# Patient Record
Sex: Male | Born: 1965
Health system: Southern US, Community
[De-identification: ages and names within clinical notes are randomized; demographics above are authoritative.]

## PROBLEM LIST (undated history)

## (undated) DIAGNOSIS — G4730 Sleep apnea, unspecified: Secondary | ICD-10-CM

## (undated) DIAGNOSIS — Z8619 Personal history of other infectious and parasitic diseases: Secondary | ICD-10-CM

## (undated) DIAGNOSIS — E119 Type 2 diabetes mellitus without complications: Secondary | ICD-10-CM

## (undated) DIAGNOSIS — M109 Gout, unspecified: Secondary | ICD-10-CM

## (undated) HISTORY — DX: Personal history of other infectious and parasitic diseases: Z86.19

## (undated) HISTORY — DX: Sleep apnea, unspecified: G47.30

## (undated) HISTORY — DX: Type 2 diabetes mellitus without complications: E11.9

---

## 2004-04-13 HISTORY — PX: OTHER SURGICAL HISTORY: SHX169

## 2004-04-30 HISTORY — PX: OTHER SURGICAL HISTORY: SHX169

## 2009-07-06 DIAGNOSIS — E781 Pure hyperglyceridemia: Secondary | ICD-10-CM | POA: Insufficient documentation

## 2013-09-02 LAB — LIPID PANEL
Cholesterol: 164 mg/dL (ref 0–200)
HDL: 29 mg/dL — AB (ref 35–70)
LDL CALC: 89 mg/dL
TRIGLYCERIDES: 229 mg/dL — AB (ref 40–160)

## 2013-09-02 LAB — TSH: TSH: 2.64 u[IU]/mL (ref 0.41–5.90)

## 2013-09-02 LAB — BASIC METABOLIC PANEL: Glucose: 107 mg/dL

## 2015-10-27 ENCOUNTER — Encounter: Payer: Self-pay | Admitting: Family Medicine

## 2015-10-27 ENCOUNTER — Ambulatory Visit (INDEPENDENT_AMBULATORY_CARE_PROVIDER_SITE_OTHER): Payer: BC Managed Care – PPO | Admitting: Family Medicine

## 2015-10-27 VITALS — BP 118/80 | HR 72 | Temp 98.9°F | Resp 14 | Ht 67.75 in | Wt 310.0 lb

## 2015-10-27 DIAGNOSIS — J069 Acute upper respiratory infection, unspecified: Secondary | ICD-10-CM | POA: Diagnosis not present

## 2015-10-27 DIAGNOSIS — R05 Cough: Secondary | ICD-10-CM | POA: Diagnosis not present

## 2015-10-27 DIAGNOSIS — M109 Gout, unspecified: Secondary | ICD-10-CM | POA: Insufficient documentation

## 2015-10-27 DIAGNOSIS — K219 Gastro-esophageal reflux disease without esophagitis: Secondary | ICD-10-CM | POA: Insufficient documentation

## 2015-10-27 DIAGNOSIS — R030 Elevated blood-pressure reading, without diagnosis of hypertension: Secondary | ICD-10-CM

## 2015-10-27 DIAGNOSIS — Z8739 Personal history of other diseases of the musculoskeletal system and connective tissue: Secondary | ICD-10-CM | POA: Insufficient documentation

## 2015-10-27 DIAGNOSIS — IMO0001 Reserved for inherently not codable concepts without codable children: Secondary | ICD-10-CM | POA: Insufficient documentation

## 2015-10-27 DIAGNOSIS — G4733 Obstructive sleep apnea (adult) (pediatric): Secondary | ICD-10-CM | POA: Insufficient documentation

## 2015-10-27 DIAGNOSIS — R059 Cough, unspecified: Secondary | ICD-10-CM

## 2015-10-27 MED ORDER — BENZONATATE 100 MG PO CAPS: 100.0000 mg | ORAL_CAPSULE | Freq: Two times a day (BID) | ORAL | Status: DC | PRN

## 2015-10-27 MED ORDER — AMOXICILLIN 500 MG PO CAPS: 1000.0000 mg | ORAL_CAPSULE | Freq: Two times a day (BID) | ORAL | Status: AC

## 2015-10-27 NOTE — Progress Notes (Signed)
Patient ID: Adam Wolfe, male   DOB: Sep 17, 1966, 50 y.o.   MRN: [id number]       Patient: Adam Wolfe Male    DOB: 07-03-1966   50 y.o.   MRN: [id number] Visit Date: 10/27/2015  Today's Provider: Lelon Huh, MD   Chief Complaint  Patient presents with  . Cough   Subjective:    HPI  Patient has had trouble with dry cough for 3 days now. Other symptoms include: rattling in the chest, chest tightness, slight headache. NO fever or chills. He has taking some Nyquil.no daytime medications.     No Known Allergies Previous Medications   ALLOPURINOL (ZYLOPRIM) 300 MG TABLET    Take 1 tablet by mouth daily.   OMEGA-3 FATTY ACIDS (FISH OIL) 1000 MG CAPS    Take 3 capsules by mouth 2 (two) times daily with a meal. Reported on 10/27/2015    Review of Systems  Constitutional: Negative.   HENT: Positive for congestion. Negative for ear discharge, ear pain, nosebleeds, postnasal drip, sinus pressure and sore throat.   Respiratory: Positive for cough and chest tightness. Negative for shortness of breath.   Cardiovascular: Negative.   Musculoskeletal: Negative.     Social History  Substance Use Topics  . Smoking status: Never Smoker   . Smokeless tobacco: Current User    Types: Chew  . Alcohol Use: 0.0 oz/week    0 Standard drinks or equivalent per week     Comment: occasional use   Objective:   BP 118/80 mmHg  Pulse 72  Temp(Src) 98.9 F (37.2 C)  Resp 14  Ht 5' 7.75" (1.721 m)  Wt 310 lb (140.615 kg)  BMI 47.48 kg/m2  SpO2 97%  Physical Exam  General Appearance:    Alert, cooperative, no distress, morbidly obese  HENT:   ENT exam normal, no neck nodes or sinus tenderness  Eyes:    PERRL, conjunctiva/corneas clear, EOM's intact       Lungs:     Rare rhonchi which clears with cough. No rales or wheezes bilaterally, respirations unlabored  Heart:    Regular rate and rhythm  Neurologic:   Awake, alert, oriented x 3. No apparent focal neurological           defect.           Assessment & Plan:     1. Upper respiratory infection Counseled regarding signs and symptoms of viral and bacterial respiratory infections. Advised to start antibiotic if he develops any sign of bacterial infection, or if current symptoms last longer than 7 days.   - amoxicillin (AMOXIL) 500 MG capsule; Take 2 capsules (1,000 mg total) by mouth 2 (two) times daily.  Dispense: 40 capsule; Refill: 0  2. Cough OTC Mucinex  - benzonatate (TESSALON) 100 MG capsule; Take 1 capsule (100 mg total) by mouth 2 (two) times daily as needed for cough.  Dispense: 20 capsule; Refill: 0       Lelon Huh, MD  [facility]

## 2015-10-27 NOTE — Patient Instructions (Signed)
   Start taking OTC Mucinex (guaifenesin) for cough  Start taking amoxicillin if you develop fever, shortness of breath, or if symptoms are not improving within a week   Cough, Adult Coughing is a reflex that clears your throat and your airways. Coughing helps to heal and protect your lungs. It is normal to cough occasionally, but a cough that happens with other symptoms or lasts a long time may be a sign of a condition that needs treatment. A cough may last only 2-3 weeks (acute), or it may last longer than 8 weeks (chronic). CAUSES Coughing is commonly caused by:  Breathing in substances that irritate your lungs.  A viral or bacterial respiratory infection.  Allergies.  Asthma.  Postnasal drip.  Smoking.  Acid backing up from the stomach into the esophagus (gastroesophageal reflux).  Certain medicines.  Chronic lung problems, including COPD (or rarely, lung cancer).  Other medical conditions such as heart failure. HOME CARE INSTRUCTIONS  Pay attention to any changes in your symptoms. Take these actions to help with your discomfort:  Take medicines only as told by your health care provider.  If you were prescribed an antibiotic medicine, take it as told by your health care provider. Do not stop taking the antibiotic even if you start to feel better.  Talk with your health care provider before you take a cough suppressant medicine.  Drink enough fluid to keep your urine clear or pale yellow.  If the air is dry, use a cold steam vaporizer or humidifier in your bedroom or your home to help loosen secretions.  Avoid anything that causes you to cough at work or at home.  If your cough is worse at night, try sleeping in a semi-upright position.  Avoid cigarette smoke. If you smoke, quit smoking. If you need help quitting, ask your health care provider.  Avoid caffeine.  Avoid alcohol.  Rest as needed. SEEK MEDICAL CARE IF:   You have new symptoms.  You cough up  pus.  Your cough does not get better after 2-3 weeks, or your cough gets worse.  You cannot control your cough with suppressant medicines and you are losing sleep.  You develop pain that is getting worse or pain that is not controlled with pain medicines.  You have a fever.  You have unexplained weight loss.  You have night sweats. SEEK IMMEDIATE MEDICAL CARE IF:  You cough up blood.  You have difficulty breathing.  Your heartbeat is very fast.   This information is not intended to replace advice given to you by your health care provider. Make sure you discuss any questions you have with your health care provider.   Document Released: 03/29/2011 Document Revised: 06/21/2015 Document Reviewed: 12/07/2014 Elsevier Interactive Patient Education [employer].

## 2015-10-31 ENCOUNTER — Encounter: Payer: Self-pay | Admitting: Family Medicine

## 2015-12-31 ENCOUNTER — Other Ambulatory Visit: Payer: Self-pay | Admitting: Family Medicine

## 2016-04-24 DIAGNOSIS — L03115 Cellulitis of right lower limb: Secondary | ICD-10-CM | POA: Diagnosis not present

## 2016-04-28 ENCOUNTER — Inpatient Hospital Stay
Admission: EM | Admit: 2016-04-28 | Discharge: 2016-05-02 | DRG: 603 | Disposition: A | Discharge status: Home / Self Care | Payer: BC Managed Care – PPO | Attending: Internal Medicine | Admitting: Internal Medicine

## 2016-04-28 ENCOUNTER — Encounter: Payer: Self-pay | Admitting: Emergency Medicine

## 2016-04-28 DIAGNOSIS — T501X5A Adverse effect of loop [high-ceiling] diuretics, initial encounter: Secondary | ICD-10-CM | POA: Diagnosis present

## 2016-04-28 DIAGNOSIS — N179 Acute kidney failure, unspecified: Secondary | ICD-10-CM | POA: Diagnosis present

## 2016-04-28 DIAGNOSIS — Z825 Family history of asthma and other chronic lower respiratory diseases: Secondary | ICD-10-CM

## 2016-04-28 DIAGNOSIS — L270 Generalized skin eruption due to drugs and medicaments taken internally: Secondary | ICD-10-CM | POA: Diagnosis present

## 2016-04-28 DIAGNOSIS — F17220 Nicotine dependence, chewing tobacco, uncomplicated: Secondary | ICD-10-CM | POA: Diagnosis not present

## 2016-04-28 DIAGNOSIS — M109 Gout, unspecified: Secondary | ICD-10-CM | POA: Diagnosis not present

## 2016-04-28 DIAGNOSIS — L03115 Cellulitis of right lower limb: Secondary | ICD-10-CM | POA: Diagnosis not present

## 2016-04-28 DIAGNOSIS — Z881 Allergy status to other antibiotic agents status: Secondary | ICD-10-CM | POA: Diagnosis not present

## 2016-04-28 DIAGNOSIS — Z8249 Family history of ischemic heart disease and other diseases of the circulatory system: Secondary | ICD-10-CM | POA: Diagnosis not present

## 2016-04-28 DIAGNOSIS — Z79899 Other long term (current) drug therapy: Secondary | ICD-10-CM

## 2016-04-28 DIAGNOSIS — T368X5A Adverse effect of other systemic antibiotics, initial encounter: Secondary | ICD-10-CM | POA: Diagnosis not present

## 2016-04-28 DIAGNOSIS — L0390 Cellulitis, unspecified: Secondary | ICD-10-CM | POA: Diagnosis present

## 2016-04-28 DIAGNOSIS — Z809 Family history of malignant neoplasm, unspecified: Secondary | ICD-10-CM | POA: Diagnosis not present

## 2016-04-28 HISTORY — DX: Gout, unspecified: M10.9

## 2016-04-28 LAB — COMPREHENSIVE METABOLIC PANEL
ALK PHOS: 51 U/L (ref 38–126)
ALT: 38 U/L (ref 17–63)
AST: 39 U/L (ref 15–41)
Albumin: 3.6 g/dL (ref 3.5–5.0)
Anion gap: 9 (ref 5–15)
BILIRUBIN TOTAL: 0.3 mg/dL (ref 0.3–1.2)
BUN: 15 mg/dL (ref 6–20)
CALCIUM: 9 mg/dL (ref 8.9–10.3)
CO2: 23 mmol/L (ref 22–32)
CREATININE: 1.16 mg/dL (ref 0.61–1.24)
Chloride: 104 mmol/L (ref 101–111)
GFR calc Af Amer: >60 mL/min (ref >60)
GLUCOSE: 171 mg/dL — AB (ref 65–99)
POTASSIUM: 4.1 mmol/L (ref 3.5–5.1)
Sodium: 136 mmol/L (ref 135–145)
TOTAL PROTEIN: 7.5 g/dL (ref 6.5–8.1)

## 2016-04-28 LAB — CBC
HEMATOCRIT: 45.7 % (ref 40.0–52.0)
Hemoglobin: 15.7 g/dL (ref 13.0–18.0)
MCH: 31.3 pg (ref 26.0–34.0)
MCHC: 34.4 g/dL (ref 32.0–36.0)
MCV: 91 fL (ref 80.0–100.0)
PLATELETS: 223 10*3/uL (ref 150–440)
RBC: 5.02 MIL/uL (ref 4.40–5.90)
RDW: 13.7 % (ref 11.5–14.5)
WBC: 8.1 10*3/uL (ref 3.8–10.6)

## 2016-04-28 MED ORDER — VANCOMYCIN HCL 10 G IV SOLR
2000.0000 mg | INTRAVENOUS | Status: AC
  Administered 2016-04-28: 2000 mg via INTRAVENOUS
  Filled 2016-04-28: qty 2000

## 2016-04-28 MED ORDER — ACETAMINOPHEN 325 MG PO TABS: 650.0000 mg | ORAL_TABLET | Freq: Four times a day (QID) | ORAL | Status: DC | PRN

## 2016-04-28 MED ORDER — VANCOMYCIN HCL 10 G IV SOLR
1250.0000 mg | Freq: Two times a day (BID) | INTRAVENOUS | Status: DC
  Administered 2016-04-29 (×2): 1250 mg via INTRAVENOUS
  Filled 2016-04-28 (×4): qty 1250

## 2016-04-28 MED ORDER — ONDANSETRON HCL 4 MG/2ML IJ SOLN: 4.0000 mg | Freq: Four times a day (QID) | INTRAMUSCULAR | Status: DC | PRN

## 2016-04-28 MED ORDER — HYDROCODONE-ACETAMINOPHEN 5-325 MG PO TABS: 1.0000 | ORAL_TABLET | ORAL | Status: DC | PRN

## 2016-04-28 MED ORDER — ONDANSETRON HCL 4 MG PO TABS
4.0000 mg | ORAL_TABLET | Freq: Four times a day (QID) | ORAL | Status: DC | PRN
Start: 2016-04-28 — End: 2016-05-02

## 2016-04-28 MED ORDER — ACETAMINOPHEN 650 MG RE SUPP: 650.0000 mg | Freq: Four times a day (QID) | RECTAL | Status: DC | PRN

## 2016-04-28 MED ORDER — ALLOPURINOL 100 MG PO TABS
300.0000 mg | ORAL_TABLET | Freq: Every day | ORAL | Status: DC
  Administered 2016-04-29 – 2016-05-02 (×4): 300 mg via ORAL
  Filled 2016-04-28 (×4): qty 3

## 2016-04-28 NOTE — ED Notes (Signed)
2nd call, Kennyth Lose said receiving RN hasn't had a chance to look at the pt and requests another 5-10 mins. Informed of previous call and ED expectation to get pt to the floor

## 2016-04-28 NOTE — ED Notes (Addendum)
On hold 6 min, Called RN Sup to advise

## 2016-04-28 NOTE — ED Notes (Signed)
Redness and swelling to right RLE. States he went to doctor Wednesday and diagnosed with cellulitis States he is currently taking abx. States swelling and redness is getting worse. Denies fever

## 2016-04-28 NOTE — ED Notes (Signed)
Report att given to Cecille Rubin, [place]

## 2016-04-28 NOTE — H&P (Signed)
[facility] [place] NAME: Adam Wolfe    MR#:  [id number]  DATE OF BIRTH:  [birth year]  DATE OF ADMISSION:  04/28/2016  PRIMARY CARE PHYSICIAN: Lelon Huh, MD   REQUESTING/REFERRING PHYSICIAN: Dr. Larae Grooms  CHIEF COMPLAINT:   Chief Complaint  Patient presents with  . Cellulitis    HISTORY OF PRESENT ILLNESS:  Adam Wolfe  is a 50 y.o. male with a known history of Gout who presents to the hospital due to right lower extremity redness swelling and pain. Patient noticed an open scab with some redness on his right lower extremity 3-4 days ago. He was out of town in [place] and went to an emergency room and was started on some oral Bactrim and Keflex. He also received 1 dose of IV antibiotics in the ER over there. Despite being on oral antibiotics his redness and swelling has gotten worse and therefore came to the ER for further evaluation. Patient is being admitted to the hospital for treatment of right lower extreme cellulitis having failed oral outpatient antibiotics. He denies any documented fever, chills, chest pain, shortness of breath, nausea, vomiting, abdominal pain or any other associated symptoms presently.  PAST MEDICAL HISTORY:   Past Medical History  Diagnosis Date  . History of chicken pox   . Gout     PAST SURGICAL HISTORY:   Past Surgical History  Procedure Laterality Date  . Sleep study  04/30/2004    Severe obstructive sleep apnea with nocturnal desaturations- started CPAP-13  . Overnight oximetry  04/13/2004    showed sleep apnea- Sleep study was done 04/30/2004 showed severe Obstructive sleep apnea    SOCIAL HISTORY:   Social History  Substance Use Topics  . Smoking status: Never Smoker   . Smokeless tobacco: Current User    Types: Chew  . Alcohol Use: No     Comment: occasional use    FAMILY HISTORY:   Family History  Problem Relation Age of Onset  . COPD Other     smoker  .  Hypertension Mother   . Cancer Father   . COPD Father     DRUG ALLERGIES:  No Known Allergies  REVIEW OF SYSTEMS:   Review of Systems  Constitutional: Negative for fever and weight loss.  HENT: Negative for congestion, nosebleeds and tinnitus.   Eyes: Negative for blurred vision, double vision and redness.  Respiratory: Negative for cough, hemoptysis and shortness of breath.   Cardiovascular: Positive for leg swelling. Negative for chest pain, orthopnea and PND.  Gastrointestinal: Negative for nausea, vomiting, abdominal pain, diarrhea and melena.  Genitourinary: Negative for dysuria, urgency and hematuria.  Musculoskeletal: Negative for joint pain and falls.  Neurological: Negative for dizziness, tingling, sensory change, focal weakness, seizures, weakness and headaches.  Endo/Heme/Allergies: Negative for polydipsia. Does not bruise/bleed easily.  Psychiatric/Behavioral: Negative for depression and memory loss. The patient is not nervous/anxious.     MEDICATIONS AT HOME:   Prior to Admission medications   Medication Sig Start Date End Date Taking? Authorizing Provider  allopurinol (ZYLOPRIM) 300 MG tablet TAKE 1 TABLET BY MOUTH DAILY 01/01/16  Yes Birdie Sons, MD  cephALEXin (KEFLEX) 500 MG capsule Take 1,000 mg by mouth 2 (two) times daily. X 10 days. 04/24/16  Yes Historical Provider, MD  sulfamethoxazole-trimethoprim (BACTRIM DS,SEPTRA DS) 800-160 MG tablet Take 1-2 tablets by mouth See admin instructions. Take 2 tablets by mouth twice daily for 5 days, then decrease to 1 tablet by  mouth twice daily until all taken. 04/24/16  Yes Historical Provider, MD  benzonatate (TESSALON) 100 MG capsule Take 1 capsule (100 mg total) by mouth 2 (two) times daily as needed for cough. 10/27/15   Birdie Sons, MD      VITAL SIGNS:  Blood pressure 154/94, pulse 95, temperature 98.2 F (36.8 C), temperature source Oral, resp. rate 20, height 5\' 8"  (1.727 m), weight 136.079 kg (300 lb), SpO2  98 %.  PHYSICAL EXAMINATION:  Physical Exam  GENERAL:  50 y.o.-year-old patient lying in the bed with no acute distress.  EYES: Pupils equal, round, reactive to light and accommodation. No scleral icterus. Extraocular muscles intact.  HEENT: Head atraumatic, normocephalic. Oropharynx and nasopharynx clear. No oropharyngeal erythema, moist oral mucosa  NECK:  Supple, no jugular venous distention. No thyroid enlargement, no tenderness.  LUNGS: Normal breath sounds bilaterally, no wheezing, rales, rhonchi. No use of accessory muscles of respiration.  CARDIOVASCULAR: S1, S2 RRR. No murmurs, rubs, gallops, clicks.  ABDOMEN: Soft, nontender, nondistended. Bowel sounds present. No organomegaly or mass.  EXTREMITIES: No pedal edema, cyanosis, or clubbing. + 2 pedal & radial pulses b/l. Right lower extremity redness and swelling without acute drainage consistent with cellulitis.  NEUROLOGIC: Cranial nerves II through XII are intact. No focal Motor or sensory deficits appreciated b/l PSYCHIATRIC: The patient is alert and oriented x 3. Good affect.  SKIN: No lesion, or ulcer. Right Lower extremity rash consistent with cellulitis.  LABORATORY PANEL:   CBC  Recent Labs Lab 04/28/16 1538  WBC 8.1  HGB 15.7  HCT 45.7  PLT 223   ------------------------------------------------------------------------------------------------------------------  Chemistries   Recent Labs Lab 04/28/16 1538  NA 136  K 4.1  CL 104  CO2 23  GLUCOSE 171*  BUN 15  CREATININE 1.16  CALCIUM 9.0  AST 39  ALT 38  ALKPHOS 51  BILITOT 0.3   ------------------------------------------------------------------------------------------------------------------  Cardiac Enzymes No results for input(s): TROPONINI in the last 168 hours. ------------------------------------------------------------------------------------------------------------------  RADIOLOGY:  No results found.   IMPRESSION AND PLAN:    50 year old male with past medical history of gout who presents to the hospital due to right lower extremity redness swelling and pain.  1. Right lower extremity cellulitis-this is a cause of patient's swelling and redness and pain. -He has failed outpatient oral antibiotics with Bactrim and Keflex. -I will start him on IV vancomycin. Follow clinically. Follow cultures. -Clinically he is afebrile, hemodynamically stable.  2. History of gout-no acute attack. Continue allopurinol.    All the records are reviewed and case discussed with ED provider. Management plans discussed with the patient, family and they are in agreement.  CODE STATUS: Full  TOTAL TIME TAKING CARE OF THIS PATIENT: 40 minutes.    Henreitta Leber M.D on 04/28/2016 at 6:19 PM  Between 7am to 6pm - Pager - [phone]  After 6pm go to [email or web address] - password [email or web address]  Office  [phone]  CC: Primary care physician; Lelon Huh, MD

## 2016-04-28 NOTE — ED Notes (Addendum)
Called for report, accepting RN not at work att d/t shift change, advised to called back in 15 min  Awaiting Vancomycin from pharm

## 2016-04-28 NOTE — ED Notes (Signed)
MD at bedside. 

## 2016-04-28 NOTE — Progress Notes (Signed)
ANTIBIOTIC CONSULT NOTE - INITIAL  Pharmacy Consult for Vancomycin  Indication: cellulitis  No Known Allergies  Patient Measurements: Height: 5\' 8"  (172.7 cm) Weight: (!) 307 lb 1.6 oz (139.3 kg) IBW/kg (Calculated) : 68.4 Adjusted Body Weight: 95.5 kg   Vital Signs: Temp: 98 F (36.7 C) (07/16 1946) Temp Source: Oral (07/16 1946) BP: 142/92 mmHg (07/16 1946) Pulse Rate: 74 (07/16 1946) Intake/Output from previous day:   Intake/Output from this shift:    Labs:  Recent Labs  04/28/16 1538  WBC 8.1  HGB 15.7  PLT 223  CREATININE 1.16   Estimated Creatinine Clearance: 106.6 mL/min (by C-G formula based on Cr of 1.16). No results for input(s): VANCOTROUGH, VANCOPEAK, VANCORANDOM, GENTTROUGH, GENTPEAK, GENTRANDOM, TOBRATROUGH, TOBRAPEAK, TOBRARND, AMIKACINPEAK, AMIKACINTROU, AMIKACIN in the last 72 hours.   Microbiology: No results found for this or any previous visit (from the past 720 hour(s)).  Medical History: Past Medical History  Diagnosis Date  . History of chicken pox   . Gout     Medications:  Scheduled:  . allopurinol  300 mg Oral Daily  . [START ON 04/29/2016] vancomycin  1,250 mg Intravenous Q12H  . vancomycin  2,000 mg Intravenous STAT   Assessment: CrCl = 105.2 ml/min  Ke = 0.092 hr-1 T1/2 = 7.53 hrs Vd = 66.9 L   Goal of Therapy:  Vancomycin trough level 10-15 mcg/ml  Plan:  Expected duration 7 days with resolution of temperature and/or normalization of WBC   Vancomycin 2 gm IV X 1 given on 7/16 @ 20:00. Vancomycin 1250 mg IV Q12H ordered to start on 7/17 @ 8:00, ~ 12 hrs after 1st dose (stacked dosing). This pt will reach [facility] by 7/18 @ 8:00. Will draw 1st trough on 7/18 @ 7:30, which will be at [facility].   Nikola Blackston D 04/28/2016,8:17 PM

## 2016-04-28 NOTE — ED Notes (Signed)
Informed RN bed ready [id number] 

## 2016-04-28 NOTE — ED Provider Notes (Signed)
[facility] Emergency Department Provider Note    ____________________________________________  Time seen: Approximately 530 PM  I have reviewed the triage vital signs and the nursing notes.   HISTORY  Chief Complaint Cellulitis   HPI Adam Wolfe is a 50 y.o. male with a history of gout who is presenting to the emergency department with right lower extremity cellulitis which has been worsening over the past 4 days. He said he was seen at an emergency department in [place] this past Thursday at about 2 AM when he received a dose of IV antibiotics. He then began taking Bactrim as well as Keflex starting the following morning. He has been taking these medications as prescribed over the past 4 days and the area of redness has gotten bigger. He showed me a picture on his cell phone where there is redness was started on the right distal portion of his leg around a scabbed area and it has progressed since. He says that he has also felt hot and a temperature of 99 last night but no temperature greater than 100.4.Patient said that he also underwent water rafting but this was in fresh water and not brackish or saltwater.   Past Medical History  Diagnosis Date  . History of chicken pox   . Gout     Patient Active Problem List   Diagnosis Date Noted  . Blood pressure elevated 10/27/2015  . GERD (gastroesophageal reflux disease) 10/27/2015  . Gout 10/27/2015  . Obstructive sleep apnea 10/27/2015  . Hypertriglyceridemia 07/06/2009    Past Surgical History  Procedure Laterality Date  . Sleep study  04/30/2004    Severe obstructive sleep apnea with nocturnal desaturations- started CPAP-13  . Overnight oximetry  04/13/2004    showed sleep apnea- Sleep study was done 04/30/2004 showed severe Obstructive sleep apnea    Current Outpatient Rx  Name  Route  Sig  Dispense  Refill  . allopurinol (ZYLOPRIM) 300 MG tablet      TAKE 1 TABLET BY MOUTH DAILY   30  tablet   6   . cephALEXin (KEFLEX) 500 MG capsule   Oral   Take 1,000 mg by mouth 2 (two) times daily. X 10 days.         Marland Kitchen sulfamethoxazole-trimethoprim (BACTRIM DS,SEPTRA DS) 800-160 MG tablet   Oral   Take 1-2 tablets by mouth See admin instructions. Take 2 tablets by mouth twice daily for 5 days, then decrease to 1 tablet by mouth twice daily until all taken.         . benzonatate (TESSALON) 100 MG capsule   Oral   Take 1 capsule (100 mg total) by mouth 2 (two) times daily as needed for cough.   20 capsule   0     Allergies Review of patient's allergies indicates no known allergies.  Family History  Problem Relation Age of Onset  . COPD Other     smoker  . Hypertension Mother   . Cancer Father   . COPD Father     Social History Social History  Substance Use Topics  . Smoking status: Never Smoker   . Smokeless tobacco: Current User    Types: Chew  . Alcohol Use: No     Comment: occasional use    Review of Systems Constitutional: No fever/chills Eyes: No visual changes. ENT: No sore throat. Cardiovascular: Denies chest pain. Respiratory: Denies shortness of breath. Gastrointestinal: No abdominal pain.  No nausea, no vomiting.  No diarrhea.  No  constipation. Genitourinary: Negative for dysuria. Musculoskeletal: Negative for back pain. Skin: As above Neurological: Negative for headaches, focal weakness or numbness.  10-point ROS otherwise negative.  ____________________________________________   PHYSICAL EXAM:  VITAL SIGNS: ED Triage Vitals  Enc Vitals Group     BP 04/28/16 1533 154/94 mmHg     Pulse Rate 04/28/16 1533 95     Resp 04/28/16 1533 20     Temp 04/28/16 1533 98.2 F (36.8 C)     Temp Source 04/28/16 1533 Oral     SpO2 04/28/16 1533 98 %     Weight 04/28/16 1533 300 lb (136.079 kg)     Height 04/28/16 1533 5\' 8"  (1.727 m)     Head Cir --      Peak Flow --      Pain Score 04/28/16 1534 3     Pain Loc --      Pain Edu? --       Excl. in [place]? --     Constitutional: Alert and oriented. Well appearing and in no acute distress. Eyes: Conjunctivae are normal. PERRL. EOMI. Head: Atraumatic. Nose: No congestion/rhinnorhea. Mouth/Throat: Mucous membranes are moist.   Neck: No stridor.   Cardiovascular: Normal rate, regular rhythm. Grossly normal heart sounds.  Good peripheral circulation. Respiratory: Normal respiratory effort.  No retractions. Lungs CTAB. Gastrointestinal: Soft and nontender. No distention. Musculoskeletal: Right lower extremity with erythema anteriorly as well as around the ankle. It is tender to palpation with warmth. There is a scabbed area to the medial ankle which is nonfluctuant and there is no pus expressed. Neurologic:  Normal speech and language. No gross focal neurologic deficits are appreciated.  Skin:  Skin is warm, dry and intact. No rash noted. Psychiatric: Mood and affect are normal. Speech and behavior are normal.  ____________________________________________   LABS (all labs ordered are listed, but only abnormal results are displayed)  Labs Reviewed  COMPREHENSIVE METABOLIC PANEL - Abnormal; Notable for the following:    Glucose, Bld 171 (*)    All other components within normal limits  CBC   ____________________________________________  EKG   ____________________________________________  RADIOLOGY   ____________________________________________   PROCEDURES   Procedures  ___________________________________________   INITIAL IMPRESSION / ASSESSMENT AND PLAN / ED COURSE  Pertinent labs & imaging results that were available during my care of the patient were reviewed by me and considered in my medical decision making (see chart for details).  Patient to be admitted for failure of outpatient treatment for cellulitis of the right lower extremity. Unlikely to be DVT as the area of interest seems to have started at a small area around the scab to the right lower  extremity. The plan to the family as well as the patient. presented to Dr. Verdell Carmine. ____________________________________________   FINAL CLINICAL IMPRESSION(S) / ED DIAGNOSES  Final diagnoses:  Cellulitis of right lower extremity      NEW MEDICATIONS STARTED DURING THIS VISIT:  New Prescriptions   No medications on file     Note:  This document was prepared using Dragon voice recognition software and may include unintentional dictation errors.    Orbie Pyo, MD 04/28/16 [phone]

## 2016-04-29 LAB — CBC
HCT: 41.8 % (ref 40.0–52.0)
Hemoglobin: 14.6 g/dL (ref 13.0–18.0)
MCH: 32.1 pg (ref 26.0–34.0)
MCHC: 34.9 g/dL (ref 32.0–36.0)
MCV: 92 fL (ref 80.0–100.0)
PLATELETS: 204 10*3/uL (ref 150–440)
RBC: 4.54 MIL/uL (ref 4.40–5.90)
RDW: 13.8 % (ref 11.5–14.5)
WBC: 7.5 10*3/uL (ref 3.8–10.6)

## 2016-04-29 MED ORDER — ENOXAPARIN SODIUM 40 MG/0.4ML ~~LOC~~ SOLN
40.0000 mg | Freq: Two times a day (BID) | SUBCUTANEOUS | Status: DC
  Administered 2016-04-29 – 2016-05-02 (×6): 40 mg via SUBCUTANEOUS
  Filled 2016-04-29 (×6): qty 0.4

## 2016-04-29 NOTE — Progress Notes (Signed)
Adam Wolfe    MR#:  [id number]  DATE OF BIRTH:  [birth year]  SUBJECTIVE:  CHIEF COMPLAINT:   Chief Complaint  Patient presents with  . Cellulitis  Pain is well controlled, redness still there but going down REVIEW OF SYSTEMS:  Review of Systems  Constitutional: Negative for fever, weight loss, malaise/fatigue and diaphoresis.  HENT: Negative for ear discharge, ear pain, hearing loss, nosebleeds, sore throat and tinnitus.   Eyes: Negative for blurred vision and pain.  Respiratory: Negative for cough, hemoptysis, shortness of breath and wheezing.   Cardiovascular: Positive for leg swelling. Negative for chest pain, palpitations and orthopnea.  Gastrointestinal: Negative for heartburn, nausea, vomiting, abdominal pain, diarrhea, constipation and blood in stool.  Genitourinary: Negative for dysuria, urgency and frequency.  Musculoskeletal: Negative for myalgias and back pain.  Skin: Positive for rash. Negative for itching.  Neurological: Negative for dizziness, tingling, tremors, focal weakness, seizures, weakness and headaches.  Psychiatric/Behavioral: Negative for depression. The patient is not nervous/anxious.    DRUG ALLERGIES:  No Known Allergies VITALS:  Blood pressure 124/85, pulse 72, temperature 98.4 F (36.9 C), temperature source Oral, resp. rate 18, height 5\' 8"  (1.727 m), weight 139.3 kg (307 lb 1.6 oz), SpO2 99 %. PHYSICAL EXAMINATION:  Physical Exam  Constitutional: He is oriented to person, place, and time and well-developed, well-nourished, and in no distress.  HENT:  Head: Normocephalic and atraumatic.  Eyes: Conjunctivae and EOM are normal. Pupils are equal, round, and reactive to light.  Neck: Normal range of motion. Neck supple. No tracheal deviation present. No thyromegaly present.  Cardiovascular: Normal rate, regular rhythm and normal heart sounds.   Pulmonary/Chest: Effort normal  and breath sounds normal. No respiratory distress. He has no wheezes. He exhibits no tenderness.  Abdominal: Soft. Bowel sounds are normal. He exhibits no distension. There is no tenderness.  Musculoskeletal: Normal range of motion.  Neurological: He is alert and oriented to person, place, and time. No cranial nerve deficit.  Skin: Skin is warm and dry. Rash noted.     Right lower extremity redness and swelling without acute drainage consistent with cellulitis.   Psychiatric: Mood and affect normal.   LABORATORY PANEL:   CBC  Recent Labs Lab 04/29/16 0414  WBC 7.5  HGB 14.6  HCT 41.8  PLT 204   ------------------------------------------------------------------------------------------------------------------ Chemistries   Recent Labs Lab 04/28/16 1538  NA 136  K 4.1  CL 104  CO2 23  GLUCOSE 171*  BUN 15  CREATININE 1.16  CALCIUM 9.0  AST 39  ALT 38  ALKPHOS 51  BILITOT 0.3   RADIOLOGY:  No results found. ASSESSMENT AND PLAN:  50 year old male with past medical history of gout who presents to the hospital due to right lower extremity redness swelling and pain.  1. Right lower extremity cellulitis-this is a cause of patient's swelling and redness and pain. - Continue IV vancomycin. Follow clinically. Follow cultures. -Clinically he is afebrile, hemodynamically stable.  2. History of gout-no acute attack. Continue allopurinol.     All the records are reviewed and case discussed with Care Management/Social Worker. Management plans discussed with the patient, family and they are in agreement.  CODE STATUS: Full code  TOTAL TIME TAKING CARE OF THIS PATIENT: 25 minutes.   More than 50% of the time was spent in counseling/coordination of care: YES  POSSIBLE D/C IN 1-2 DAYS, DEPENDING ON CLINICAL CONDITION.   Adam Wolfe M.D  on 04/29/2016 at 11:26 AM  Between 7am to 6pm - Pager - [phone]  After 6pm go to [email or web address] - Proofreader  [facility]  Office  [phone]  CC: Primary care physician; Adam Huh, MD  Note: This dictation was prepared with Dragon dictation along with smaller phrase technology. Any transcriptional errors that result from this process are unintentional.

## 2016-04-29 NOTE — Progress Notes (Signed)
   04/29/16 1000  Clinical Encounter Type  Visited With Patient  Visit Type Initial  Consult/Referral To Chaplain  Stress Factors  Patient Stress Factors Health changes  I met with the pt to discuss overall well-being.  Pt shows positive attitude towards his recovery.

## 2016-04-30 LAB — BASIC METABOLIC PANEL
ANION GAP: 7 (ref 5–15)
BUN: 16 mg/dL (ref 6–20)
CHLORIDE: 102 mmol/L (ref 101–111)
CO2: 26 mmol/L (ref 22–32)
Calcium: 8.8 mg/dL — ABNORMAL LOW (ref 8.9–10.3)
Creatinine, Ser: 1.43 mg/dL — ABNORMAL HIGH (ref 0.61–1.24)
GFR, EST NON AFRICAN AMERICAN: 57 mL/min — AB (ref >60)
Glucose, Bld: 114 mg/dL — ABNORMAL HIGH (ref 65–99)
POTASSIUM: 4.5 mmol/L (ref 3.5–5.1)
SODIUM: 135 mmol/L (ref 135–145)

## 2016-04-30 LAB — CBC
HCT: 43 % (ref 40.0–52.0)
HEMOGLOBIN: 14.9 g/dL (ref 13.0–18.0)
MCH: 32 pg (ref 26.0–34.0)
MCHC: 34.7 g/dL (ref 32.0–36.0)
MCV: 92.2 fL (ref 80.0–100.0)
PLATELETS: 222 10*3/uL (ref 150–440)
RBC: 4.66 MIL/uL (ref 4.40–5.90)
RDW: 13.9 % (ref 11.5–14.5)
WBC: 7.8 10*3/uL (ref 3.8–10.6)

## 2016-04-30 LAB — VANCOMYCIN, TROUGH: VANCOMYCIN TR: 8 ug/mL — AB (ref 15–20)

## 2016-04-30 MED ORDER — VANCOMYCIN HCL 10 G IV SOLR
1500.0000 mg | Freq: Two times a day (BID) | INTRAVENOUS | Status: DC
  Filled 2016-04-30 (×2): qty 1500

## 2016-04-30 MED ORDER — FUROSEMIDE 10 MG/ML IJ SOLN
40.0000 mg | Freq: Two times a day (BID) | INTRAMUSCULAR | Status: DC
  Administered 2016-04-30 (×2): 40 mg via INTRAVENOUS
  Filled 2016-04-30 (×2): qty 4

## 2016-04-30 MED ORDER — VANCOMYCIN HCL IN DEXTROSE 1-5 GM/200ML-% IV SOLN
1000.0000 mg | Freq: Three times a day (TID) | INTRAVENOUS | Status: DC
  Administered 2016-04-30 (×3): 1000 mg via INTRAVENOUS
  Filled 2016-04-30 (×6): qty 200

## 2016-04-30 MED ORDER — VANCOMYCIN HCL 10 G IV SOLR
1500.0000 mg | Freq: Two times a day (BID) | INTRAVENOUS | Status: DC
  Filled 2016-04-30: qty 1500

## 2016-04-30 NOTE — Progress Notes (Signed)
[place] [facility] at [place] NAME: Adam Wolfe    MR#:  [id number]  DATE OF BIRTH:  1966-05-03  SUBJECTIVE:  CHIEF COMPLAINT:   Chief Complaint  Patient presents with  . Cellulitis  Pain is well controlled, redness still there but going down, Swelling in the leg is somewhat down REVIEW OF SYSTEMS:  Review of Systems  Constitutional: Negative for fever, weight loss, malaise/fatigue and diaphoresis.  HENT: Negative for ear discharge, ear pain, hearing loss, nosebleeds, sore throat and tinnitus.   Eyes: Negative for blurred vision and pain.  Respiratory: Negative for cough, hemoptysis, shortness of breath and wheezing.   Cardiovascular: Positive for leg swelling. Negative for chest pain, palpitations and orthopnea.  Gastrointestinal: Negative for heartburn, nausea, vomiting, abdominal pain, diarrhea, constipation and blood in stool.  Genitourinary: Negative for dysuria, urgency and frequency.  Musculoskeletal: Negative for myalgias and back pain.  Skin: Positive for rash. Negative for itching.  Neurological: Negative for dizziness, tingling, tremors, focal weakness, seizures, weakness and headaches.  Psychiatric/Behavioral: Negative for depression. The patient is not nervous/anxious.    DRUG ALLERGIES:  No Known Allergies VITALS:  Blood pressure 135/96, pulse 70, temperature 98.8 F (37.1 C), temperature source Oral, resp. rate 19, height 5\' 8"  (1.727 m), weight 139.3 kg (307 lb 1.6 oz), SpO2 100 %. PHYSICAL EXAMINATION:  Physical Exam  Constitutional: He is oriented to person, place, and time and well-developed, well-nourished, and in no distress.  HENT:  Head: Normocephalic and atraumatic.  Eyes: Conjunctivae and EOM are normal. Pupils are equal, round, and reactive to light.  Neck: Normal range of motion. Neck supple. No tracheal deviation present. No thyromegaly present.  Cardiovascular: Normal rate, regular rhythm and normal heart  sounds.   Pulmonary/Chest: Effort normal and breath sounds normal. No respiratory distress. He has no wheezes. He exhibits no tenderness.  Abdominal: Soft. Bowel sounds are normal. He exhibits no distension. There is no tenderness.  Musculoskeletal: Normal range of motion.  Neurological: He is alert and oriented to person, place, and time. No cranial nerve deficit.  Skin: Skin is warm and dry. Rash noted.     Right lower extremity redness and swelling without acute drainage consistent with cellulitis.   Psychiatric: Mood and affect normal.   LABORATORY PANEL:   CBC  Recent Labs Lab 04/29/16 0414  WBC 7.5  HGB 14.6  HCT 41.8  PLT 204   ------------------------------------------------------------------------------------------------------------------ Chemistries   Recent Labs Lab 04/28/16 1538  NA 136  K 4.1  CL 104  CO2 23  GLUCOSE 171*  BUN 15  CREATININE 1.16  CALCIUM 9.0  AST 39  ALT 38  ALKPHOS 51  BILITOT 0.3   RADIOLOGY:  No results found. ASSESSMENT AND PLAN:  50 year old male with past medical history of gout who presents to the hospital due to right lower extremity redness swelling and pain.  1. Right lower extremity cellulitis-this is a cause of patient's swelling and redness and pain. - Continue IV vancomycin. Follow clinically. No cultures as no open wound -Clinically he is afebrile, hemodynamically stable. - We will try Lasix 40 mg IV twice a day for 24 hours and see if we can get some swelling down in the leg.  It may help healing  2. History of gout-no acute attack. Continue allopurinol.     All the records are reviewed and case discussed with Care Management/Social Worker. Management plans discussed with the patient, family and they are in agreement.  CODE STATUS:  Full code  TOTAL TIME TAKING CARE OF THIS PATIENT: 25 minutes.   More than 50% of the time was spent in counseling/coordination of care: YES  POSSIBLE D/C IN AM, DEPENDING ON  CLINICAL CONDITION.   [facility], Marilee Ditommaso M.D on 04/30/2016 at 7:44 AM  Between 7am to 6pm - Pager - [phone]  After 6pm go to [email or web address] - Proofreader  [facility] [place] [facility]  Office  [phone]  CC: Primary care physician; Lelon Huh, MD  Note: This dictation was prepared with Dragon dictation along with smaller phrase technology. Any transcriptional errors that result from this process are unintentional.

## 2016-04-30 NOTE — Progress Notes (Signed)
ANTIBIOTIC CONSULT NOTE - INITIAL  Pharmacy Consult for Vancomycin  Indication: cellulitis  No Known Allergies  Patient Measurements: Height: 5\' 8"  (172.7 cm) Weight: (!) 307 lb 1.6 oz (139.3 kg) IBW/kg (Calculated) : 68.4 Adjusted Body Weight: 95.5 kg   Vital Signs: Temp: 98.6 F (37 C) (07/18 0745) Temp Source: Oral (07/18 0745) BP: 138/92 mmHg (07/18 0745) Pulse Rate: 74 (07/18 0745)   Recent Labs  04/28/16 1538 04/29/16 0414 04/30/16 0721  WBC 8.1 7.5 7.8  HGB 15.7 14.6 14.9  PLT 223 204 222  CREATININE 1.16  --  1.43*   Estimated Creatinine Clearance: 86.5 mL/min (by C-G formula based on Cr of 1.43).  Recent Labs  04/30/16 0721  [place] 8*     Microbiology: No results found for this or any previous visit (from the past 720 hour(s)).  Medical History: Past Medical History  Diagnosis Date  . History of chicken pox   . Gout     Medications:  Scheduled:  . allopurinol  300 mg Oral Daily  . enoxaparin (LOVENOX) injection  40 mg Subcutaneous Q12H  . furosemide  40 mg Intravenous BID  . vancomycin  1,000 mg Intravenous Q8H   Assessment: CrCl = 86 ml/min  Ke = 0.104 hr-1 T1/2 = 6.66 hrs Vd = 67 L   Goal of Therapy:  Vancomycin trough level 10-15 mcg/ml  Plan:  Expected duration 7 days with resolution of temperature and/or normalization of WBC   Trough was subtherapeutic today at 8. Will change regimen to Vancomycin 1gm every 8 hours. Calculated trough at Css is 12.5, but expect such accumulation based on patients weight (BMI 46) and increase in Scr.  Will order trough prior to 5th dose and continue to monitor patients labs and renal function.   Nancy Fetter, PharmD Clinical Pharmacist 04/30/2016 8:47 AM

## 2016-05-01 MED ORDER — DOXYCYCLINE HYCLATE 100 MG PO TABS
100.0000 mg | ORAL_TABLET | Freq: Two times a day (BID) | ORAL | Status: DC
  Administered 2016-05-01 – 2016-05-02 (×3): 100 mg via ORAL
  Filled 2016-05-01 (×3): qty 1

## 2016-05-01 MED ORDER — DIPHENHYDRAMINE HCL 25 MG PO CAPS
25.0000 mg | ORAL_CAPSULE | Freq: Three times a day (TID) | ORAL | Status: DC
  Administered 2016-05-01 – 2016-05-02 (×5): 25 mg via ORAL
  Filled 2016-05-01 (×5): qty 1

## 2016-05-01 MED ORDER — CEPHALEXIN 500 MG PO CAPS
500.0000 mg | ORAL_CAPSULE | Freq: Three times a day (TID) | ORAL | Status: DC
  Administered 2016-05-01 – 2016-05-02 (×3): 500 mg via ORAL
  Filled 2016-05-01 (×3): qty 1

## 2016-05-01 NOTE — Progress Notes (Signed)
[place] at [place] NAME: Adam Wolfe    MR#:  [id number]  DATE OF BIRTH:  04/17/1966  SUBJECTIVE:  CHIEF COMPLAINT:   Chief Complaint  Patient presents with  . Cellulitis  Now has an erythematous maculopapular rash all over his body, Creat up some REVIEW OF SYSTEMS:  Review of Systems  Constitutional: Negative for fever, weight loss, malaise/fatigue and diaphoresis.  HENT: Negative for ear discharge, ear pain, hearing loss, nosebleeds, sore throat and tinnitus.   Eyes: Negative for blurred vision and pain.  Respiratory: Negative for cough, hemoptysis, shortness of breath and wheezing.   Cardiovascular: Negative for chest pain, palpitations and orthopnea.  Gastrointestinal: Negative for heartburn, nausea, vomiting, abdominal pain, diarrhea, constipation and blood in stool.  Genitourinary: Negative for dysuria, urgency and frequency.  Musculoskeletal: Negative for myalgias and back pain.  Skin: Positive for rash. Negative for itching.  Neurological: Negative for dizziness, tingling, tremors, focal weakness, seizures, weakness and headaches.  Psychiatric/Behavioral: Negative for depression. The patient is not nervous/anxious.    DRUG ALLERGIES:  No Known Allergies VITALS:  Blood pressure 140/80, pulse 95, temperature 98.6 F (37 C), temperature source Oral, resp. rate 19, height 5\' 8"  (1.727 m), weight 134.582 kg (296 lb 11.2 oz), SpO2 100 %. PHYSICAL EXAMINATION:  Physical Exam  Constitutional: He is oriented to person, place, and time and well-developed, well-nourished, and in no distress.  HENT:  Head: Normocephalic and atraumatic.  Eyes: Conjunctivae and EOM are normal. Pupils are equal, round, and reactive to light.  Neck: Normal range of motion. Neck supple. No tracheal deviation present. No thyromegaly present.  Cardiovascular: Normal rate, regular rhythm and normal heart sounds.   Pulmonary/Chest: Effort normal and breath  sounds normal. No respiratory distress. He has no wheezes. He exhibits no tenderness.  Abdominal: Soft. Bowel sounds are normal. He exhibits no distension. There is no tenderness.  Musculoskeletal: Normal range of motion.  Neurological: He is alert and oriented to person, place, and time. No cranial nerve deficit.  Skin: Skin is warm and dry. Rash noted.     Right lower extremity redness and swelling improving.   Erythematous maculopapular rash all over his body, mainly in the extremities  Psychiatric: Mood and affect normal.   LABORATORY PANEL:   CBC  Recent Labs Lab 04/30/16 0721  WBC 7.8  HGB 14.9  HCT 43.0  PLT 222   ------------------------------------------------------------------------------------------------------------------ Chemistries   Recent Labs Lab 04/28/16 1538 04/30/16 0721  NA 136 135  K 4.1 4.5  CL 104 102  CO2 23 26  GLUCOSE 171* 114*  BUN 15 16  CREATININE 1.16 1.43*  CALCIUM 9.0 8.8*  AST 39  --   ALT 38  --   ALKPHOS 51  --   BILITOT 0.3  --    RADIOLOGY:  No results found. ASSESSMENT AND PLAN:  50 year old male with past medical history of gout who presents to the hospital due to right lower extremity redness swelling and pain.  1. Right lower extremity cellulitis-Was on IV vancomycin But now creatinine went up and has a generalized erythematous maculopapular rash all over his body- Likely reaction to Lasix but cannot rule out relation to vancomycin, either so stop Vancomycin   2. Drug rash - Likely due to Lasix (some sulfa component) but cannot exclude vancomycin, either - We will switch her antibiotics to oral doxycycline and Keflex, discussed with infectious disease Dr. Alton Revere at [facility]  3. ARF:  - Likely  due to receiving Lasix 40 mg twice yesterday, which was intended to get some fluid off of his lower extremities - Stop Lasix, can also be due to vancomycin toxicity, so we will stop that.   4. History of gout-no acute attack.  Continue allopurinol.     All the records are reviewed and case discussed with Care Management/Social Worker. Management plans discussed with the patient, family and they are in agreement.  CODE STATUS: Full code  TOTAL TIME TAKING CARE OF THIS PATIENT: 25 minutes.   More than 50% of the time was spent in counseling/coordination of care: YES  POSSIBLE D/C IN AM, DEPENDING ON CLINICAL CONDITION.   [facility], Kemya Shed M.D on 05/01/2016 at 9:10 AM  Between 7am to 6pm - Pager - [phone]  After 6pm go to [email or web address] - Proofreader  [facility] [place] [facility]  Office  [phone]  CC: Primary care physician; Lelon Huh, MD  Note: This dictation was prepared with Dragon dictation along with smaller phrase technology. Any transcriptional errors that result from this process are unintentional.

## 2016-05-01 NOTE — Care Management (Signed)
Patient states he now developed a rash on his back and right arm he is relating to antibiotic. He states the nurse is aware. No problems breathing. He states he lives with his wife and independent with mobility. PCP is Dr. Lelon Huh. He denies RNCM needs.

## 2016-05-02 LAB — BASIC METABOLIC PANEL
ANION GAP: 6 (ref 5–15)
BUN: 19 mg/dL (ref 6–20)
CHLORIDE: 100 mmol/L — AB (ref 101–111)
CO2: 30 mmol/L (ref 22–32)
Calcium: 9.2 mg/dL (ref 8.9–10.3)
Creatinine, Ser: 1.35 mg/dL — ABNORMAL HIGH (ref 0.61–1.24)
GFR calc non Af Amer: >60 mL/min (ref >60)
Glucose, Bld: 198 mg/dL — ABNORMAL HIGH (ref 65–99)
Potassium: 4.5 mmol/L (ref 3.5–5.1)
Sodium: 136 mmol/L (ref 135–145)

## 2016-05-02 LAB — CBC
HEMATOCRIT: 43.6 % (ref 40.0–52.0)
HEMOGLOBIN: 15.5 g/dL (ref 13.0–18.0)
MCH: 31.9 pg (ref 26.0–34.0)
MCHC: 35.6 g/dL (ref 32.0–36.0)
MCV: 89.8 fL (ref 80.0–100.0)
Platelets: 247 10*3/uL (ref 150–440)
RBC: 4.85 MIL/uL (ref 4.40–5.90)
RDW: 13.5 % (ref 11.5–14.5)
WBC: 8 10*3/uL (ref 3.8–10.6)

## 2016-05-02 MED ORDER — CEPHALEXIN 500 MG PO CAPS: 500.0000 mg | ORAL_CAPSULE | Freq: Three times a day (TID) | ORAL | Status: DC

## 2016-05-02 MED ORDER — DOXYCYCLINE HYCLATE 100 MG PO TABS: 100.0000 mg | ORAL_TABLET | Freq: Two times a day (BID) | ORAL | Status: AC

## 2016-05-02 MED ORDER — ACETAMINOPHEN 325 MG PO TABS: 650.0000 mg | ORAL_TABLET | Freq: Four times a day (QID) | ORAL | Status: DC | PRN

## 2016-05-02 MED ORDER — DIPHENHYDRAMINE HCL 25 MG PO CAPS: 25.0000 mg | ORAL_CAPSULE | Freq: Three times a day (TID) | ORAL | Status: DC

## 2016-05-02 NOTE — Discharge Instructions (Signed)
Activity as tolerated Diet regular Follow-up with primary care physician in a week

## 2016-05-02 NOTE — Progress Notes (Signed)
Inpatient Diabetes Program Recommendations  AACE/ADA: New Consensus Statement on Inpatient Glycemic Control (2015)  Target Ranges:  Prepandial:   less than 140 mg/dL      Peak postprandial:   less than 180 mg/dL (1-2 hours)      Critically ill patients:  140 - 180 mg/dL   No results found for: GLUCAP, HGBA1C  Review of Glycemic Control  Results for Adam Wolfe, Adam Wolfe (MRN [id number]) as of 05/02/2016 10:29  Ref. Range 04/28/2016 15:38 04/29/2016 04:14 04/30/2016 07:21 05/02/2016 05:05  Glucose Latest Ref Range: 65-99 mg/dL 171 (H)  114 (H) 198 (H)    Diabetes history:none Outpatient Diabetes medications: none Current orders for Inpatient glycemic control: none  Inpatient Diabetes Program Recommendations:   Elevated lab glucose-  Per ADA recommendations "consider performing an A1C on all patients with diabetes or hypreglycemia admitted to the hospital if not performed in the prior 3 months".  Gentry Fitz, RN, BA, MHA, CDE Diabetes Coordinator Inpatient Diabetes Program  [phone] (Team Pager) [phone] ([place]) 05/02/2016 10:31 AM

## 2016-05-02 NOTE — Discharge Summary (Signed)
Adam Wolfe NAME: Adam Wolfe    MR#:  [id number]  DATE OF BIRTH:  18-Aug-1966  DATE OF ADMISSION:  04/28/2016 ADMITTING PHYSICIAN: Henreitta Leber, MD  DATE OF DISCHARGE: 05/02/2016  PRIMARY CARE PHYSICIAN: Lelon Huh, MD    ADMISSION DIAGNOSIS:  Cellulitis of right lower extremity [id number]  DISCHARGE DIAGNOSIS:  Right lower extremity cellulitis Allergy to vancomycin and probably Lasix Rash resolved  SECONDARY DIAGNOSIS:   Past Medical History  Diagnosis Date  . History of chicken pox   . Gout     HOSPITAL COURSE:   50 year old male with past medical history of gout who presents to the hospital due to right lower extremity redness swelling and pain.  1. Right lower extremity cellulitis- Clinically improved with IV vancomycin but has developed a rash and vancomycin was discontinued creatinine is trending down 1.43-1.35 IV Lasix was given for lower extremity edema, edema improved but patient developed rash thought to be from both Lasix and vancomycin and both were discontinued Patient is started on Keflex continues to improve we will discharge patient home with Keflex and doxycycline  2. Drug rash - Likely due to Lasix (some sulfa component) but cannot exclude vancomycin, either, rash is resolved during my examination today - Patient's antibiotics were switched  to oral doxycycline and Keflex, discussed with infectious disease Dr. Alton Revere at [facility]  3. ARF:  - Likely due to receiving Lasix 40 mg twice 7/18, which was intended to get some fluid off of his lower extremities - Stopped Lasix, can also be due to vancomycin toxicity, which was discontinued and renal function started improving Patient will be discharged home as he prefers going home today  4. History of gout-no acute attack. Continue allopurinol.  DISCHARGE CONDITIONS:   Fair  CONSULTS OBTAINED:  Treatment Team:  Thayer Headings,  MD   PROCEDURES None  DRUG ALLERGIES:  No Known Allergies  DISCHARGE MEDICATIONS:   Current Discharge Medication List    START taking these medications   Details  acetaminophen (TYLENOL) 325 MG tablet Take 2 tablets (650 mg total) by mouth every 6 (six) hours as needed for mild pain (or Fever >/= 101).    diphenhydrAMINE (BENADRYL) 25 mg capsule Take 1 capsule (25 mg total) by mouth 4 (four) times daily - after meals and at bedtime. Qty: 30 capsule, Refills: 0    doxycycline (VIBRA-TABS) 100 MG tablet Take 1 tablet (100 mg total) by mouth every 12 (twelve) hours. Qty: 14 tablet, Refills: 0      CONTINUE these medications which have CHANGED   Details  cephALEXin (KEFLEX) 500 MG capsule Take 1 capsule (500 mg total) by mouth every 8 (eight) hours. Qty: 30 capsule, Refills: 0      CONTINUE these medications which have NOT CHANGED   Details  allopurinol (ZYLOPRIM) 300 MG tablet TAKE 1 TABLET BY MOUTH DAILY Qty: 30 tablet, Refills: 6    benzonatate (TESSALON) 100 MG capsule Take 1 capsule (100 mg total) by mouth 2 (two) times daily as needed for cough. Qty: 20 capsule, Refills: 0   Associated Diagnoses: Cough      STOP taking these medications     sulfamethoxazole-trimethoprim (BACTRIM DS,SEPTRA DS) 800-160 MG tablet          DISCHARGE INSTRUCTIONS:   Activity as tolerated Diet regular Follow-up with primary care physician in a week Follow-up with infectious disease Dr. Ola Spurr as needed in a week or if symptoms  are not getting better  DIET:  regular  DISCHARGE CONDITION:  Fair  ACTIVITY:  Activity as tolerated  OXYGEN:  Home Oxygen: No.   Oxygen Delivery: room air  DISCHARGE LOCATION:  home   If you experience worsening of your admission symptoms, develop shortness of breath, life threatening emergency, suicidal or homicidal thoughts you must seek medical attention immediately by calling 911 or calling your MD immediately  if symptoms less  severe.  You Must read complete instructions/literature along with all the possible adverse reactions/side effects for all the Medicines you take and that have been prescribed to you. Take any new Medicines after you have completely understood and accpet all the possible adverse reactions/side effects.   Please note  You were cared for by a hospitalist during your hospital stay. If you have any questions about your discharge medications or the care you received while you were in the hospital after you are discharged, you can call the unit and asked to speak with the hospitalist on call if the hospitalist that took care of you is not available. Once you are discharged, your primary care physician will handle any further medical issues. Please note that NO REFILLS for any discharge medications will be authorized once you are discharged, as it is imperative that you return to your primary care physician (or establish a relationship with a primary care physician if you do not have one) for your aftercare needs so that they can reassess your need for medications and monitor your lab values.     Today  Chief Complaint  Patient presents with  . Cellulitis   Pt is feeeling better . Rash resolved , RLE redness continues to improve as reported by the patient and wants to be discharged today  ROS:  CONSTITUTIONAL: Denies fevers, chills. Denies any fatigue, weakness.  EYES: Denies blurry vision, double vision, eye pain. EARS, NOSE, THROAT: Denies tinnitus, ear pain, hearing loss. RESPIRATORY: Denies cough, wheeze, shortness of breath.  CARDIOVASCULAR: Denies chest pain, palpitations, edema.  GASTROINTESTINAL: Denies nausea, vomiting, diarrhea, abdominal pain. Denies bright red blood per rectum. GENITOURINARY: Denies dysuria, hematuria. ENDOCRINE: Denies nocturia or thyroid problems. HEMATOLOGIC AND LYMPHATIC: Denies easy bruising or bleeding. SKIN: Rash is resolved. Right lower extremity redness  significantly improved according to the patient MUSCULOSKELETAL: Denies pain in neck, back, shoulder, knees, hips or arthritic symptoms.  NEUROLOGIC: Denies paralysis, paresthesias.  PSYCHIATRIC: Denies anxiety or depressive symptoms.   VITAL SIGNS:  Blood pressure 125/84, pulse 75, temperature 97.6 F (36.4 C), temperature source Oral, resp. rate 20, height 5\' 8"  (1.727 m), weight 135.943 kg (299 lb 11.2 oz), SpO2 96 %.  I/O:    Intake/Output Summary (Last 24 hours) at 05/02/16 1218 Last data filed at 05/02/16 0900  Gross per 24 hour  Intake    240 ml  Output      0 ml  Net    240 ml    PHYSICAL EXAMINATION:  GENERAL:  50 y.o.-year-old patient lying in the bed with no acute distress.  EYES: Pupils equal, round, reactive to light and accommodation. No scleral icterus. Extraocular muscles intact.  HEENT: Head atraumatic, normocephalic. Oropharynx and nasopharynx clear.  NECK:  Supple, no jugular venous distention. No thyroid enlargement, no tenderness.  LUNGS: Normal breath sounds bilaterally, no wheezing, rales,rhonchi or crepitation. No use of accessory muscles of respiration.  CARDIOVASCULAR: S1, S2 normal. No murmurs, rubs, or gallops.  ABDOMEN: Soft, non-tender, non-distended. Bowel sounds present. No organomegaly or mass.  EXTREMITIES: Right lower  extremity  redness is better. No pustules, nontender. Improving according to the patient No pedal edema, cyanosis, or clubbing.  NEUROLOGIC: Cranial nerves II through XII are intact. Muscle strength 5/5 in all extremities. Sensation intact. Gait not checked.  PSYCHIATRIC: The patient is alert and oriented x 3.  SKIN: No obvious rash, lesion, or ulcer.   DATA REVIEW:   CBC  Recent Labs Lab 05/02/16 0505  WBC 8.0  HGB 15.5  HCT 43.6  PLT 247    Chemistries   Recent Labs Lab 04/28/16 1538  05/02/16 0505  NA 136  < > 136  K 4.1  < > 4.5  CL 104  < > 100*  CO2 23  < > 30  GLUCOSE 171*  < > 198*  BUN 15  < > 19   CREATININE 1.16  < > 1.35*  CALCIUM 9.0  < > 9.2  AST 39  --   --   ALT 38  --   --   ALKPHOS 51  --   --   BILITOT 0.3  --   --   < > = values in this interval not displayed.  Cardiac Enzymes No results for input(s): TROPONINI in the last 168 hours.  Microbiology Results  No results found for this or any previous visit.  RADIOLOGY:  No results found.  EKG:  No orders found for this or any previous visit.    Management plans discussed with the patient, family and they are in agreement.  CODE STATUS:     Code Status Orders        Start     Ordered   04/28/16 1946  Full code   Continuous     04/28/16 1945    Code Status History    Date Active Date Inactive Code Status Order ID Comments User Context   This patient has a current code status but no historical code status.      TOTAL TIME TAKING CARE OF THIS PATIENT: 45 minutes.   Note: This dictation was prepared with Dragon dictation along with smaller phrase technology. Any transcriptional errors that result from this process are unintentional.   @[facility] @  on 05/02/2016 at 12:18 PM  Between 7am to 6pm - Pager - [phone]  After 6pm go to [email or web address] - password [email or web address] [facility]  Office  [phone]  CC: Primary care physician; Lelon Huh, MD

## 2016-05-02 NOTE — Progress Notes (Signed)
Patient was discharged home with wife. Reviewed discharge instruction, including last dose of meds given and follow-up appointment.  Allowed time for questions.  IV removed with cath intact.

## 2016-05-07 ENCOUNTER — Ambulatory Visit
Admission: RE | Admit: 2016-05-07 | Discharge: 2016-05-07 | Disposition: A | Discharge status: Home / Self Care | Payer: BC Managed Care – PPO | Source: Ambulatory Visit | Attending: Family Medicine | Admitting: Family Medicine

## 2016-05-07 ENCOUNTER — Telehealth: Payer: Self-pay | Admitting: Emergency Medicine

## 2016-05-07 ENCOUNTER — Encounter: Payer: Self-pay | Admitting: Family Medicine

## 2016-05-07 ENCOUNTER — Ambulatory Visit (INDEPENDENT_AMBULATORY_CARE_PROVIDER_SITE_OTHER): Payer: BC Managed Care – PPO | Admitting: Family Medicine

## 2016-05-07 VITALS — BP 136/92 | HR 84 | Temp 97.9°F | Wt 309.0 lb

## 2016-05-07 DIAGNOSIS — L03115 Cellulitis of right lower limb: Secondary | ICD-10-CM

## 2016-05-07 DIAGNOSIS — R600 Localized edema: Secondary | ICD-10-CM | POA: Diagnosis not present

## 2016-05-07 MED ORDER — CEPHALEXIN 500 MG PO CAPS: 500.0000 mg | ORAL_CAPSULE | Freq: Four times a day (QID) | ORAL | 0 refills | Status: AC

## 2016-05-07 NOTE — Progress Notes (Signed)
Subjective:     Patient ID: Adam Wolfe, male   DOB: Jun 07, 1966, 50 y.o.   MRN: [id number]  HPI  Chief Complaint  Patient presents with  . Cellulitis    Right leg; Not improving  Here in f/u of [facility] hospitalization from 7/16-7/20. States his leg remains red, warm, tender and swollen. Reports compliance with Keflex and doxycycline. Accompanied by his wife today. They wish referral to I.D.   Review of Systems     Objective:   Physical Exam  Constitutional: He appears well-developed and well-nourished. No distress.  Cardiovascular:  Pulses:      Dorsalis pedis pulses are 2+ on the right side.       Posterior tibial pulses are 2+ on the right side.  Musculoskeletal: He exhibits edema (2+ pedal right lower extremity).  Skin:  Right lower extremity anterior aspect is dusky red but no drainage noted.       Assessment:    1. Cellulitis of right lower extremity: will check for DVT due to persistent swelling - Ambulatory referral to Infectious Disease - US Venous Img Lower Unilateral Right; Future    Plan:    Increase Keflex to 4 x day pending I.D/ Consult. Suggested that change in skin color may suggest near resolution of his infection.

## 2016-05-07 NOTE — Patient Instructions (Addendum)
We will call you with the ultrasound result. Increase Keflex to 4 x day.

## 2016-05-07 NOTE — Telephone Encounter (Signed)
[facility] called with a call report for pt doppler. It was negative and they let the pt leave. Thanks.

## 2016-05-15 ENCOUNTER — Encounter: Payer: Self-pay | Admitting: Family Medicine

## 2016-05-15 ENCOUNTER — Ambulatory Visit (INDEPENDENT_AMBULATORY_CARE_PROVIDER_SITE_OTHER): Payer: BC Managed Care – PPO | Admitting: Family Medicine

## 2016-05-15 VITALS — BP 124/100 | HR 68 | Temp 97.8°F | Resp 16 | Wt 312.2 lb

## 2016-05-15 DIAGNOSIS — L03115 Cellulitis of right lower limb: Secondary | ICD-10-CM

## 2016-05-15 NOTE — Progress Notes (Signed)
Subjective:     Patient ID: Adam Wolfe, male   DOB: 09-Apr-1966, 51 y.o.   MRN: [id number]  HPI  Chief Complaint  Patient presents with  . Cellulitis    Patient comes in office today for follow up visit, patient was last seen on 05/07/16 for hospital follow up. Patient states since last visit he remains with swelling to his lower extremity on his right leg and peeling of the skin. Patient states " it feels like I have restless leg syndrome," patient reports that he is still taking Keflex and is tolerating well.  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 Continues to elevate at home. Reports he is just beginning the 4 x day Keflex. Accompanied by his wife today.   Review of Systems     Objective:   Physical Exam  Constitutional: He appears well-developed and well-nourished. No distress.  Skin:  Right lower extremity skin is starting to scale off with erythema fading. 3+ pedal edema remains.       Assessment:    1. Cellulitis of right lower extremity: improving, 4" ACE wrap applied to minimize swelling while up.     Plan:    Complete abx, leg elevation, ACE wrap when up. Keep appointment with I.D 8/31 if needed.

## 2016-05-15 NOTE — Patient Instructions (Signed)
Complete Keflex and continue elevation of legs at home. Try the ACE wrap during the day for swelling. Keep appointment with infectious disease doctor if not better.

## 2016-06-13 ENCOUNTER — Ambulatory Visit (INDEPENDENT_AMBULATORY_CARE_PROVIDER_SITE_OTHER): Payer: BC Managed Care – PPO | Admitting: Internal Medicine

## 2016-06-13 ENCOUNTER — Encounter: Payer: Self-pay | Admitting: Internal Medicine

## 2016-06-13 VITALS — BP 132/94 | HR 92 | Temp 98.2°F | Ht 68.0 in | Wt 313.0 lb

## 2016-06-13 DIAGNOSIS — I872 Venous insufficiency (chronic) (peripheral): Secondary | ICD-10-CM

## 2016-06-13 DIAGNOSIS — L03818 Cellulitis of other sites: Secondary | ICD-10-CM | POA: Diagnosis not present

## 2016-06-13 NOTE — Progress Notes (Signed)
    [facility]      Reason for Consult:recent cellulitis    Referring Physician: Dr. Caryn Section    Patient ID: Adam Wolfe, male    DOB: 1965/12/17, 50 y.o.   MRN: [id number]  HPI:   Here for follow up on cellulitis.  Initially noted on a trip to [place] and started on antibioitcs with bactrim and keflex.  Did not improve quickly and went to ED at [facility] and noted continued cellulitis and admitted for IV vancomycin.  Improved though he did develop a rash and some renal insufficiency.  Then sent out on doxy and keflex and completed 2 weeks ago.  He did note significant warmth, redness and tenderness which has since resolved.  He does still have some redness and swelling but no warmth or tenderness.  He was recently scratched by his dog so some open areas.  Previous record reviewed from his hospitalization, as above.  Did have venous doppler and negative for DVT.    Past Medical History:  Diagnosis Date  . Gout   . History of chicken pox     Prior to Admission medications   Medication Sig Start Date End Date Taking? Authorizing Provider  allopurinol (ZYLOPRIM) 300 MG tablet TAKE 1 TABLET BY MOUTH DAILY 01/01/16  Yes Birdie Sons, MD    Allergies  Allergen Reactions  . Lasix [Furosemide] Hives    Social History  Substance Use Topics  . Smoking status: Never Smoker  . Smokeless tobacco: Current User    Types: Chew  . Alcohol use No     Comment: occasional use    Family History  Problem Relation Age of Onset  . COPD Other     smoker  . Hypertension Mother   . Cancer Father   . COPD Father     Review of Systems  Constitutional: negative for fevers, chills and fatigue Gastrointestinal: negative for diarrhea All other systems reviewed and are negative   Constitutional: in no apparent distress and alert  Vitals:   06/13/16 1349  BP: (!) 132/94  Pulse: 92  Temp: 98.2 F (36.8 C)   EYES: anicteric ENMT: Cardiovascular: Cor  RRR Respiratory: CTA B; normal effort GI: obese Musculoskeletal: right leg with mild pitting edema, skin changes and darkened areas c/w vascular insufficiency; left leg without any skin changes Skin: negatives: no rash Hematologic: no cervical lad  Labs: Lab Results  Component Value Date   WBC 8.0 05/02/2016   HGB 15.5 05/02/2016   HCT 43.6 05/02/2016   MCV 89.8 05/02/2016   PLT 247 05/02/2016    Lab Results  Component Value Date   CREATININE 1.35 (H) 05/02/2016   BUN 19 05/02/2016   NA 136 05/02/2016   K 4.5 05/02/2016   CL 100 (L) 05/02/2016   CO2 30 05/02/2016    Lab Results  Component Value Date   ALT 38 04/28/2016   AST 39 04/28/2016   ALKPHOS 51 04/28/2016   BILITOT 0.3 04/28/2016     Assessment: cellulitis, resolved; venous insuffiency.  I suspect residual skin changes are an element of vascular insufficiency.  No signs of infection at this time.  I did discuss his best option for avoidance of future issues is weight loss and exercise.   Plan: 1) no further treatment indicated 2) Keflex for reoccurence of cellulitis in the future

## 2016-09-02 ENCOUNTER — Other Ambulatory Visit: Payer: Self-pay

## 2016-09-02 ENCOUNTER — Other Ambulatory Visit: Payer: Self-pay | Admitting: Family Medicine

## 2016-09-02 MED ORDER — ALLOPURINOL 300 MG PO TABS: 300.0000 mg | ORAL_TABLET | Freq: Every day | ORAL | 4 refills | Status: DC

## 2016-09-02 MED ORDER — ALLOPURINOL 300 MG PO TABS: 300.0000 mg | ORAL_TABLET | Freq: Every day | ORAL | 12 refills | Status: DC

## 2016-09-02 NOTE — Telephone Encounter (Signed)
Pharmacy requesting refills.

## 2016-09-17 ENCOUNTER — Other Ambulatory Visit: Payer: Self-pay | Admitting: Family Medicine

## 2016-12-24 DIAGNOSIS — G4733 Obstructive sleep apnea (adult) (pediatric): Secondary | ICD-10-CM | POA: Diagnosis not present

## 2017-02-28 ENCOUNTER — Ambulatory Visit (INDEPENDENT_AMBULATORY_CARE_PROVIDER_SITE_OTHER): Payer: BC Managed Care – PPO | Admitting: Family Medicine

## 2017-02-28 ENCOUNTER — Encounter: Payer: Self-pay | Admitting: Family Medicine

## 2017-02-28 VITALS — BP 130/88 | HR 76 | Temp 97.7°F | Resp 16 | Wt 321.0 lb

## 2017-02-28 DIAGNOSIS — G4733 Obstructive sleep apnea (adult) (pediatric): Secondary | ICD-10-CM | POA: Diagnosis not present

## 2017-02-28 DIAGNOSIS — J069 Acute upper respiratory infection, unspecified: Secondary | ICD-10-CM | POA: Diagnosis not present

## 2017-02-28 DIAGNOSIS — H66001 Acute suppurative otitis media without spontaneous rupture of ear drum, right ear: Secondary | ICD-10-CM

## 2017-02-28 MED ORDER — AMOXICILLIN 500 MG PO CAPS: 1000.0000 mg | ORAL_CAPSULE | Freq: Two times a day (BID) | ORAL | 0 refills | Status: AC

## 2017-02-28 NOTE — Progress Notes (Signed)
Patient: Adam Wolfe Male    DOB: [birth year]   51 y.o.   MRN: [id number] Visit Date: 02/28/2017  Today's Provider: Lelon Huh, MD   Chief Complaint  Patient presents with  . Ear Pain   Subjective:    Patient states that he started having cough and nasal congestion yesterday. Patient stated that this morning he woke up with right pain and drainage. Patient was taking otc Dayquil and nightquil with mild relief.    Otalgia   There is pain in the right ear. This is a new problem. The current episode started today. The problem occurs constantly. The problem has been gradually worsening. There has been no fever. The pain is mild. Associated symptoms include coughing, ear discharge, headaches, neck pain and rhinorrhea. Pertinent negatives include no abdominal pain, diarrhea, hearing loss, rash, sore throat or vomiting. Treatments tried: dayquil and nightquil. The treatment provided mild relief.   Follow up sleep apnea: Patient has long history of OSA on CPAP 13cm for over 10 years and is using every night and working well, however his equipment is aging and needs to be re-evaluated.     Allergies  Allergen Reactions  . Lasix [Furosemide] Hives     Current Outpatient Prescriptions:  .  allopurinol (ZYLOPRIM) 300 MG tablet, TAKE 1 TABLET BY MOUTH DAILY, Disp: 30 tablet, Rfl: 11  Review of Systems  Constitutional: Negative for appetite change, chills and fever.  HENT: Positive for congestion, ear discharge, ear pain, postnasal drip, rhinorrhea and sinus pressure. Negative for hearing loss and sore throat.   Respiratory: Positive for cough. Negative for chest tightness, shortness of breath and wheezing.   Cardiovascular: Negative for chest pain and palpitations.  Gastrointestinal: Negative for abdominal pain, diarrhea, nausea and vomiting.  Musculoskeletal: Positive for neck pain and neck stiffness.  Skin: Negative for rash.  Neurological: Positive for headaches.     Social History  Substance Use Topics  . Smoking status: Never Smoker  . Smokeless tobacco: Current User    Types: Chew  . Alcohol use No     Comment: occasional use   Objective:   BP 130/88 (BP Location: Right Arm, Patient Position: Sitting, Cuff Size: Large)   Pulse 76   Temp 97.7 F (36.5 C) (Oral)   Resp 16   Wt (!) 321 lb (145.6 kg)   SpO2 95%   BMI 48.81 kg/m  Vitals:   02/28/17 0941  BP: 130/88  Pulse: 76  Resp: 16  Temp: 97.7 F (36.5 C)  TempSrc: Oral  SpO2: 95%  Weight: (!) 321 lb (145.6 kg)     Physical Exam  General Appearance:    Alert, cooperative, no distress, morbidly obese  HENT:   left TM normal without fluid or infection, right TM red, dull, bulging, right TM fluid noted, neck has bilateral anterior cervical nodes enlarged, sinuses nontender and nasal mucosa pale and congested  Eyes:    PERRL, conjunctiva/corneas clear, EOM's intact       Lungs:     Clear to auscultation bilaterally, respirations unlabored  Heart:    Regular rate and rhythm  Neurologic:   Awake, alert, oriented x 3. No apparent focal neurological           defect.           Assessment & Plan:     1. Upper respiratory tract infection, unspecified type   2. Acute suppurative otitis media of right ear without spontaneous rupture of tympanic membrane,  recurrence not specified  - amoxicillin (AMOXIL) 500 MG capsule; Take 2 capsules (1,000 mg total) by mouth 2 (two) times daily.  Dispense: 40 capsule; Refill: 0  3. Obstructive sleep apnea Compliant with CPAP using every night, but has aging equipment which needs replaced. Sent with order for new CPAP and equipment. Advised patient he may need updated CPAP titration to quialify for new equipment.        Lelon Huh, MD  [facility]

## 2017-03-11 ENCOUNTER — Ambulatory Visit (INDEPENDENT_AMBULATORY_CARE_PROVIDER_SITE_OTHER): Payer: BC Managed Care – PPO | Admitting: Family Medicine

## 2017-03-11 ENCOUNTER — Encounter: Payer: Self-pay | Admitting: Family Medicine

## 2017-03-11 VITALS — BP 134/80 | HR 98 | Temp 97.6°F | Resp 16 | Wt 322.0 lb

## 2017-03-11 DIAGNOSIS — H9191 Unspecified hearing loss, right ear: Secondary | ICD-10-CM

## 2017-03-11 NOTE — Progress Notes (Signed)
       Patient: Adam Wolfe Male    DOB: July 02, 1966   51 y.o.   MRN: [id number] Visit Date: 03/11/2017  Today's Provider: Lelon Huh, MD   Chief Complaint  Patient presents with  . Ear Problem   Subjective:    Patient was seen in office 02/28/2017 for right ear pain. Patient was prescribed amoxicillin. Patient states that right ear pain has resolved, but  Has since felt stopped up and ringing  He states he cannot hear anything out of his right ear now. He states he has history of ruptured ear drum for which he saw Dr. Tami Ribas a few years ago, but hearing is worse now than it was then. .        Allergies  Allergen Reactions  . Lasix [Furosemide] Hives     Current Outpatient Prescriptions:  .  allopurinol (ZYLOPRIM) 300 MG tablet, TAKE 1 TABLET BY MOUTH DAILY, Disp: 30 tablet, Rfl: 11  Review of Systems  Constitutional: Negative for appetite change, chills and fever.  Respiratory: Negative for chest tightness, shortness of breath and wheezing.   Cardiovascular: Negative for chest pain and palpitations.  Gastrointestinal: Negative for abdominal pain, nausea and vomiting.    Social History  Substance Use Topics  . Smoking status: Never Smoker  . Smokeless tobacco: Current User    Types: Chew  . Alcohol use No     Comment: occasional use   Objective:   BP 134/80 (BP Location: Right Arm, Patient Position: Sitting, Cuff Size: Large)   Pulse 98   Temp 97.6 F (36.4 C) (Oral)   Resp 16   Wt (!) 322 lb (146.1 kg)   SpO2 96%   BMI 48.96 kg/m  Vitals:   03/11/17 1456  BP: 134/80  Pulse: 98  Resp: 16  Temp: 97.6 F (36.4 C)  TempSrc: Oral  SpO2: 96%  Weight: (!) 322 lb (146.1 kg)     Physical Exam  General Appearance:    Alert, cooperative, no distress  HENT:   ENT exam normal, no neck nodes or sinus tenderness  Eyes:    PERRL, conjunctiva/corneas clear, EOM's intact       Lungs:     Clear to auscultation bilaterally, respirations unlabored  Heart:     Regular rate and rhythm  Neurologic:   Awake, alert, oriented x 3. No apparent focal neurological           defect.        Hearing Screening   125Hz  250Hz  500Hz  1000Hz  2000Hz  3000Hz  4000Hz  6000Hz  8000Hz   Right ear:    20 20      Left ear:   20 25 25  25          Assessment & Plan:     1. Unilateral hearing loss, right Has completed amoxicillin and infection appears to have resolved.  - Ambulatory referral to ENT       Lelon Huh, MD  [facility]

## 2017-03-24 DIAGNOSIS — H903 Sensorineural hearing loss, bilateral: Secondary | ICD-10-CM | POA: Diagnosis not present

## 2017-03-24 DIAGNOSIS — H6501 Acute serous otitis media, right ear: Secondary | ICD-10-CM | POA: Diagnosis not present

## 2017-03-24 DIAGNOSIS — H6981 Other specified disorders of Eustachian tube, right ear: Secondary | ICD-10-CM | POA: Diagnosis not present

## 2017-04-14 DIAGNOSIS — H6980 Other specified disorders of Eustachian tube, unspecified ear: Secondary | ICD-10-CM | POA: Diagnosis not present

## 2017-04-25 ENCOUNTER — Telehealth: Payer: Self-pay | Admitting: Family Medicine

## 2017-04-25 NOTE — Telephone Encounter (Signed)
ROI signed by patient to pick up OV 02/28/17

## 2017-07-03 ENCOUNTER — Ambulatory Visit (INDEPENDENT_AMBULATORY_CARE_PROVIDER_SITE_OTHER): Payer: BC Managed Care – PPO | Admitting: Family Medicine

## 2017-07-03 ENCOUNTER — Encounter: Payer: Self-pay | Admitting: Family Medicine

## 2017-07-03 VITALS — BP 138/88 | HR 94 | Temp 98.0°F | Resp 16 | Ht 68.0 in | Wt 314.6 lb

## 2017-07-03 DIAGNOSIS — R350 Frequency of micturition: Secondary | ICD-10-CM

## 2017-07-03 DIAGNOSIS — E1121 Type 2 diabetes mellitus with diabetic nephropathy: Secondary | ICD-10-CM | POA: Insufficient documentation

## 2017-07-03 DIAGNOSIS — E119 Type 2 diabetes mellitus without complications: Secondary | ICD-10-CM

## 2017-07-03 DIAGNOSIS — E1140 Type 2 diabetes mellitus with diabetic neuropathy, unspecified: Secondary | ICD-10-CM | POA: Insufficient documentation

## 2017-07-03 DIAGNOSIS — Z23 Encounter for immunization: Secondary | ICD-10-CM

## 2017-07-03 LAB — COMPLETE METABOLIC PANEL WITH GFR
AG RATIO: 1.5 (calc) (ref 1.0–2.5)
ALBUMIN MSPROF: 4 g/dL (ref 3.6–5.1)
ALT: 54 U/L — ABNORMAL HIGH (ref 9–46)
AST: 40 U/L (ref 10–40)
Alkaline phosphatase (APISO): 86 U/L (ref 40–115)
BUN: 14 mg/dL (ref 7–25)
CALCIUM: 9.3 mg/dL (ref 8.6–10.3)
CO2: 25 mmol/L (ref 20–32)
CREATININE: 1.09 mg/dL (ref 0.60–1.35)
Chloride: 103 mmol/L (ref 98–110)
GFR, EST AFRICAN AMERICAN: 92 mL/min/{1.73_m2} (ref >=60)
GFR, EST NON AFRICAN AMERICAN: 79 mL/min/{1.73_m2} (ref >=60)
GLOBULIN: 2.6 g/dL (ref 1.9–3.7)
Glucose, Bld: 285 mg/dL — ABNORMAL HIGH (ref 65–99)
Potassium: 4.2 mmol/L (ref 3.5–5.3)
SODIUM: 138 mmol/L (ref 135–146)
TOTAL PROTEIN: 6.6 g/dL (ref 6.1–8.1)
Total Bilirubin: 0.5 mg/dL (ref 0.2–1.2)

## 2017-07-03 LAB — POCT URINALYSIS DIPSTICK
BILIRUBIN UA: NEGATIVE
Blood, UA: NEGATIVE
Glucose, UA: 2000
KETONES UA: 5
LEUKOCYTES UA: NEGATIVE
Nitrite, UA: NEGATIVE
Protein, UA: NEGATIVE
Spec Grav, UA: 1.02 (ref 1.010–1.025)
Urobilinogen, UA: 0.2 E.U./dL
pH, UA: 6 (ref 5.0–8.0)

## 2017-07-03 LAB — SPECIMEN ID NOTIFICATION MISSING 2ND ID

## 2017-07-03 LAB — POCT GLYCOSYLATED HEMOGLOBIN (HGB A1C)
ESTIMATED AVERAGE GLUCOSE: 249
Hemoglobin A1C: 10.3

## 2017-07-03 MED ORDER — GLIPIZIDE ER 2.5 MG PO TB24: 2.5000 mg | ORAL_TABLET | Freq: Every day | ORAL | 2 refills | Status: DC

## 2017-07-03 MED ORDER — METFORMIN HCL ER 500 MG PO TB24: 500.0000 mg | ORAL_TABLET | Freq: Every evening | ORAL | 2 refills | Status: DC

## 2017-07-03 NOTE — Patient Instructions (Signed)
   Start taking OTC vitamin B12 1,000mg  daily

## 2017-07-03 NOTE — Progress Notes (Signed)
Patient: Adam Wolfe Male    DOB: May 06, 1966   51 y.o.   MRN: [id number] Visit Date: 07/03/2017  Today's Provider: Lelon Huh, MD   Chief Complaint  Patient presents with  . Diabetes   Subjective:    HPI Patient here today with concerns of having Diabetes. Current symptoms include increase appetite, polydipsia and polyuria and have been worsening. Most Recent Eye Exam: 2017. Prior visit with dietician: no. Current diet: in general, a "healthy" diet  . Current exercise: none  Pertinent Labs:    Component Value Date/Time   CHOL 164 09/02/2013   TRIG 229 (A) 09/02/2013   HDL 29 (A) 09/02/2013   LDLCALC 89 09/02/2013   CREATININE 1.35 (H) 05/02/2016 0505    Wt Readings from Last 3 Encounters:  07/03/17 (!) 314 lb 9.6 oz (142.7 kg)  03/11/17 (!) 322 lb (146.1 kg)  02/28/17 (!) 321 lb (145.6 kg)    ------------------------------------------------------------------------      Allergies  Allergen Reactions  . Lasix [Furosemide] Hives     Current Outpatient Prescriptions:  .  allopurinol (ZYLOPRIM) 300 MG tablet, TAKE 1 TABLET BY MOUTH DAILY, Disp: 30 tablet, Rfl: 11  Review of Systems  Constitutional: Positive for appetite change. Negative for fatigue.  Eyes: Negative for visual disturbance.  Cardiovascular: Positive for leg swelling. Negative for chest pain and palpitations.  Gastrointestinal: Negative for nausea and vomiting.  Endocrine: Positive for polydipsia and polyuria.    Social History  Substance Use Topics  . Smoking status: Never Smoker  . Smokeless tobacco: Current User    Types: Chew  . Alcohol use No     Comment: occasional use   Objective:   BP 138/88 (BP Location: Right Arm, Patient Position: Sitting, Cuff Size: Large)   Pulse 94   Temp 98 F (36.7 C) (Oral)   Resp 16   Ht 5\' 8"  (1.727 m)   Wt (!) 314 lb 9.6 oz (142.7 kg)   SpO2 96%   BMI 47.83 kg/m  Vitals:   07/03/17 1047  BP: 138/88  Pulse: 94  Resp: 16  Temp: 98 F  (36.7 C)  TempSrc: Oral  SpO2: 96%  Weight: (!) 314 lb 9.6 oz (142.7 kg)  Height: 5\' 8"  (1.727 m)     Physical Exam  General Appearance:    Alert, cooperative, no distress, obese  Eyes:    PERRL, conjunctiva/corneas clear, EOM's intact       Lungs:     Clear to auscultation bilaterally, respirations unlabored  Heart:    Regular rate and rhythm  Neurologic:   Awake, alert, oriented x 3. No apparent focal neurological           defect.         Results for orders placed or performed in visit on 07/03/17  POCT glycosylated hemoglobin (Hb A1C)  Result Value Ref Range   Hemoglobin A1C 10.3    Est. average glucose Bld gHb Est-mCnc 249   POCT urinalysis dipstick  Result Value Ref Range   Color, UA Yellow    Clarity, UA Clear    Glucose, UA 2,000    Bilirubin, UA Negative    Ketones, UA 5    Spec Grav, UA 1.020 1.010 - 1.025   Blood, UA Negative    pH, UA 6.0 5.0 - 8.0   Protein, UA Negative    Urobilinogen, UA 0.2 0.2 or 1.0 E.U./dL   Nitrite, UA Negative    Leukocytes, UA Negative  Negative       Assessment & Plan:     1. Diabetes mellitus without complication ([place]) Newly diagnosed. Start medications below.  - glipiZIDE (GLUCOTROL XL) 2.5 MG 24 hr tablet; Take 1 tablet (2.5 mg total) by mouth daily with breakfast.  Dispense: 30 tablet; Refill: 2 - metFORMIN (GLUCOPHAGE-XR) 500 MG 24 hr tablet; Take 1 tablet (500 mg total) by mouth every evening.  Dispense: 30 tablet; Refill: 2 - COMPLETE METABOLIC PANEL WITH GFR - EKG 12-Lead - Ambulatory referral to diabetic education  2. Increased frequency of urination  - POCT glycosylated hemoglobin (Hb A1C) - POCT urinalysis dipstick  3. Influenza vaccine needed  - Flu Vaccine QUAD 36+ mos IM  Return in about 1 month (around 08/02/2017).       Lelon Huh, MD  [facility]

## 2017-07-08 ENCOUNTER — Encounter: Payer: BC Managed Care – PPO | Attending: Family Medicine | Admitting: *Deleted

## 2017-07-08 ENCOUNTER — Encounter: Payer: Self-pay | Admitting: *Deleted

## 2017-07-08 ENCOUNTER — Ambulatory Visit: Payer: BC Managed Care – PPO | Admitting: Family Medicine

## 2017-07-08 VITALS — BP 130/94 | Ht 68.0 in | Wt 315.5 lb

## 2017-07-08 DIAGNOSIS — E1165 Type 2 diabetes mellitus with hyperglycemia: Secondary | ICD-10-CM

## 2017-07-08 DIAGNOSIS — E119 Type 2 diabetes mellitus without complications: Secondary | ICD-10-CM | POA: Diagnosis not present

## 2017-07-08 NOTE — Patient Instructions (Signed)
Check blood sugars 2 x day before breakfast and 2 hrs after supper every day Bring blood sugar records to the next class  Call your doctor for a prescription for:  1. Meter strips (type) One Touch Verio checking  2 times per day  2. Lancets (type) One Touch Delica checking  2      times per day  Exercise: Begin walking  for  15   minutes 3  days a week and gradually increase to 30 minutes 5 x week (nothing strenuous until blood sugars are less than 250)  Eat 3 meals day,  1-2  snacks a day Space meals 4-6 hours apart Don't skip meals Avoid sugar sweetened drinks (tea, juices) Drink more water   Make an eye doctor appointment once blood sugars have improved  Return for classes on:

## 2017-07-09 NOTE — Progress Notes (Signed)
Diabetes Self-Management Education  Visit Type: First/Initial  Appt. Start Time: 1540 Appt. End Time: 7371  07/08/2017  Mr. Adam Wolfe, identified by name and date of birth, is a 51 y.o. male with a diagnosis of Diabetes: Type 2.   ASSESSMENT  Blood pressure (!) 130/94, height 5\' 8"  (1.727 m), weight (!) 315 lb 8 oz (143.1 kg). Body mass index is 47.97 kg/m.      Diabetes Self-Management Education - 07/08/17 1652      Visit Information   Visit Type First/Initial     Initial Visit   Diabetes Type Type 2   Are you currently following a meal plan? No   Are you taking your medications as prescribed? Yes   Date Diagnosed last week     Health Coping   How would you rate your overall health? Fair     Psychosocial Assessment   Patient Belief/Attitude about Diabetes Motivated to manage diabetes   Self-care barriers None   Self-management support Doctor's office;Family   Patient Concerns Nutrition/Meal planning;Medication;Monitoring;Healthy Lifestyle;Problem Solving;Glycemic Control;Weight Control   Special Needs None   Preferred Learning Style Auditory;Hands on   Learning Readiness Ready   How often do you need to have someone help you when you read instructions, pamphlets, or other written materials from your doctor or pharmacy? 1 - Never   What is the last grade level you completed in school? college graduate     Pre-Education Assessment   Patient understands the diabetes disease and treatment process. Needs Instruction   Patient understands incorporating nutritional management into lifestyle. Needs Instruction   Patient undertands incorporating physical activity into lifestyle. Needs Instruction   Patient understands using medications safely. Needs Instruction   Patient understands monitoring blood glucose, interpreting and using results Needs Instruction   Patient understands prevention, detection, and treatment of acute complications. Needs Instruction   Patient  understands prevention, detection, and treatment of chronic complications. Needs Instruction   Patient understands how to develop strategies to address psychosocial issues. Needs Instruction   Patient understands how to develop strategies to promote health/change behavior. Needs Instruction     Complications   Last HgB A1C per patient/outside source 10.3 %  07/03/17   How often do you check your blood sugar? 0 times/day (not testing)  Provided One Touch Verio Flex meter and instructed on use. BG upon return demonstration was 358 mg/dL at 4:30 pm - 2 hrs pp. Pt had [employer] roast beef with curly fries and large 1/2 and 1/2 tea.    Have you had a dilated eye exam in the past 12 months? No   Have you had a dental exam in the past 12 months? No   Are you checking your feet? No     Dietary Intake   Breakfast granola bar   Lunch microwave meals or left overs   Dinner spaghetti, pork, chicken, [place], fish with bread, potatoes, peas, corn, broccoli, cauliflower, rice, salads   Beverage(s) water, juice, 1/2 and 1/2 tea     Exercise   Exercise Type ADL's     Patient Education   Disease state  Definition of diabetes, type 1 and 2, and the diagnosis of diabetes   Nutrition management  Role of diet in the treatment of diabetes and the relationship between the three main macronutrients and blood glucose level;Reviewed blood glucose goals for pre and post meals and how to evaluate the patients' food intake on their blood glucose level.;Meal timing in regards to the patients' current diabetes medication.  Physical activity and exercise  Role of exercise on diabetes management, blood pressure control and cardiac health.   Medications Reviewed patients medication for diabetes, action, purpose, timing of dose and side effects.   Monitoring Taught/evaluated SMBG meter.;Purpose and frequency of SMBG.;Taught/discussed recording of test results and interpretation of SMBG.;Identified appropriate SMBG and/or A1C  goals.   Chronic complications Relationship between chronic complications and blood glucose control   Psychosocial adjustment Identified and addressed patients feelings and concerns about diabetes     Individualized Goals (developed by patient)   Reducing Risk Improve blood sugars Decrease medications Prevent diabetes complications Lose weight Lead a healthier lifestyle Become more fit     Outcomes   Expected Outcomes Demonstrated interest in learning. Expect positive outcomes   Future DMSE 4-6 wks      Individualized Plan for Diabetes Self-Management Training:   Learning Objective:  Patient will have a greater understanding of diabetes self-management. Patient education plan is to attend individual and/or group sessions per assessed needs and concerns.   Plan:   Patient Instructions  Check blood sugars 2 x day before breakfast and 2 hrs after supper every day Bring blood sugar records to the next class Call your doctor for a prescription for:  1. Meter strips (type) One Touch Verio checking  2 times per day  2. Lancets (type) One Touch Delica checking  2      times per day Exercise: Begin walking  for  15   minutes 3  days a week and gradually increase to 30 minutes 5 x week (nothing strenuous until blood sugars are less than 250) Eat 3 meals day,  1-2  snacks a day Space meals 4-6 hours apart Don't skip meals Avoid sugar sweetened drinks (tea, juices) Drink more water  Make an eye doctor appointment once blood sugars have improved  Expected Outcomes:  Demonstrated interest in learning. Expect positive outcomes  Education material provided:  General Meal Planning Guidelines Simple Meal Plan Meter = One Touch Verio Flex  If problems or questions, patient to contact team via:  Adam Wolfe, [place], [place], CDE [phone]  Future DSME appointment: 4-6 wks  August 04, 2017 for Diabetes Class 1

## 2017-07-10 ENCOUNTER — Other Ambulatory Visit: Payer: Self-pay | Admitting: Family Medicine

## 2017-07-10 MED ORDER — ONETOUCH DELICA LANCETS FINE MISC: 3 refills | Status: AC

## 2017-07-10 MED ORDER — GLUCOSE BLOOD VI STRP: 1.0000 | ORAL_STRIP | 3 refills | Status: DC | PRN

## 2017-07-10 NOTE — Telephone Encounter (Signed)
Pt needs rx for one touch delica lancets and strips one touch verio.  [facility]  Pt's call back is [phone]  [place]

## 2017-07-22 ENCOUNTER — Ambulatory Visit: Payer: BC Managed Care – PPO | Admitting: *Deleted

## 2017-08-04 ENCOUNTER — Encounter: Payer: Self-pay | Admitting: Dietician

## 2017-08-04 ENCOUNTER — Encounter: Payer: BC Managed Care – PPO | Attending: Family Medicine | Admitting: Dietician

## 2017-08-04 VITALS — Ht 68.0 in | Wt 320.4 lb

## 2017-08-04 DIAGNOSIS — E119 Type 2 diabetes mellitus without complications: Secondary | ICD-10-CM | POA: Diagnosis not present

## 2017-08-04 DIAGNOSIS — E1165 Type 2 diabetes mellitus with hyperglycemia: Secondary | ICD-10-CM

## 2017-08-04 NOTE — Progress Notes (Signed)

## 2017-08-05 ENCOUNTER — Ambulatory Visit (INDEPENDENT_AMBULATORY_CARE_PROVIDER_SITE_OTHER): Payer: BC Managed Care – PPO | Admitting: Family Medicine

## 2017-08-05 ENCOUNTER — Encounter: Payer: Self-pay | Admitting: Family Medicine

## 2017-08-05 VITALS — BP 120/80 | HR 91 | Temp 98.4°F | Resp 16 | Ht 68.0 in | Wt 325.0 lb

## 2017-08-05 DIAGNOSIS — E119 Type 2 diabetes mellitus without complications: Secondary | ICD-10-CM | POA: Diagnosis not present

## 2017-08-05 LAB — POCT GLYCOSYLATED HEMOGLOBIN (HGB A1C)
Est. average glucose Bld gHb Est-mCnc: 206
Hemoglobin A1C: 8.8

## 2017-08-05 NOTE — Progress Notes (Signed)
Patient: Adam Wolfe Male    DOB: 1966-02-17   51 y.o.   MRN: [id number] Visit Date: 08/05/2017  Today's Provider: Lelon Huh, MD   Chief Complaint  Patient presents with  . Follow-up  . Diabetes   Subjective:    HPI   Diabetes Mellitus Type II, Follow-up:   Lab Results  Component Value Date   HGBA1C 10.3 07/03/2017   Last seen for diabetes 1 months ago.  Management since then includes; newly diagnosed. Started glipizide 2.5 mg and metformin 500 mg qd. He reports good compliance with treatment. He is not having side effects. none Current symptoms include none and have been unchanged. Home blood sugar records: fasting range: 100-270. Several in the 90s the last few weeks.  Started Lifestyle classes yesterday.  Episodes of hypoglycemia? no   Current Insulin Regimen: n/a Most Recent Eye Exam: due Weight trend: stable Prior visit with dietician: no Current diet: well balanced Current exercise: walks alot at work  ----------------------------------------------------------------  [employer] from Last 3 Encounters:  08/05/17 (!) 325 lb (147.4 kg)  08/04/17 (!) 320 lb 6.4 oz (145.3 kg)  07/08/17 (!) 315 lb 8 oz (143.1 kg)        Allergies  Allergen Reactions  . Lasix [Furosemide] Hives     Current Outpatient Prescriptions:  .  allopurinol (ZYLOPRIM) 300 MG tablet, TAKE 1 TABLET BY MOUTH DAILY, Disp: 30 tablet, Rfl: 11 .  glipiZIDE (GLUCOTROL XL) 2.5 MG 24 hr tablet, Take 1 tablet (2.5 mg total) by mouth daily with breakfast., Disp: 30 tablet, Rfl: 2 .  glucose blood test strip, 1 each by Other route as needed for other. Use as instructed, Disp: 100 each, Rfl: 3 .  metFORMIN (GLUCOPHAGE-XR) 500 MG 24 hr tablet, Take 1 tablet (500 mg total) by mouth every evening., Disp: 30 tablet, Rfl: 2 .  ONETOUCH DELICA LANCETS FINE MISC, Use to check blood sugar daily, Disp: 100 each, Rfl: 3 .  vitamin B-12 (CYANOCOBALAMIN) 1000 MCG tablet, Take 1,000 mcg by  mouth daily., Disp: , Rfl:   Review of Systems  Constitutional: Negative for appetite change, chills and fever.  Respiratory: Negative for chest tightness, shortness of breath and wheezing.   Cardiovascular: Negative for chest pain and palpitations.  Gastrointestinal: Negative for abdominal pain, nausea and vomiting.    Social History  Substance Use Topics  . Smoking status: Never Smoker  . Smokeless tobacco: Current User    Types: Chew  . Alcohol use No     Comment: occasional use   Objective:   BP 120/80 (BP Location: Left Arm, Patient Position: Sitting, Cuff Size: Large)   Pulse 91   Temp 98.4 F (36.9 C) (Oral)   Resp 16   Wt (!) 345 lb (156.5 kg)   SpO2 97%   BMI 52.46 kg/m  Vitals:   08/05/17 1428  BP: 120/80  Pulse: 91  Resp: 16  Temp: 98.4 F (36.9 C)  TempSrc: Oral  SpO2: 97%  Weight: (!) 345 lb (156.5 kg)     Physical Exam  General Appearance:    Alert, cooperative, no distress, obese  Eyes:    PERRL, conjunctiva/corneas clear, EOM's intact       Lungs:     Clear to auscultation bilaterally, respirations unlabored  Heart:    Regular rate and rhythm  Neurologic:   Awake, alert, oriented x 3. No apparent focal neurological           defect.  Results for orders placed or performed in visit on 08/05/17  POCT glycosylated hemoglobin (Hb A1C)  Result Value Ref Range   Hemoglobin A1C 8.8    Est. average glucose Bld gHb Est-mCnc 206        Assessment & Plan:     1. Diabetes mellitus without complication ([place]) Improved and tolerating metformin and glipizide.  - POCT glycosylated hemoglobin (Hb A1C)  Continue current medications.  Continue Lifestyle classes. Follow up 3 months. Anticipated checking a1c, lipids and met C at follow up.       Lelon Huh, MD  [facility]

## 2017-08-05 NOTE — Patient Instructions (Addendum)
   Recommend taking 81mg  enteric coated aspirin to reduce risk of vascular events such as heart attacks and strokes.     Please contact your eyecare professional to schedule a routine eye exam

## 2017-08-11 ENCOUNTER — Encounter: Payer: BC Managed Care – PPO | Admitting: Dietician

## 2017-08-11 ENCOUNTER — Encounter: Payer: Self-pay | Admitting: Dietician

## 2017-08-11 VITALS — Wt 318.3 lb

## 2017-08-11 DIAGNOSIS — E1165 Type 2 diabetes mellitus with hyperglycemia: Secondary | ICD-10-CM

## 2017-08-11 DIAGNOSIS — E119 Type 2 diabetes mellitus without complications: Secondary | ICD-10-CM | POA: Diagnosis not present

## 2017-08-11 NOTE — Progress Notes (Signed)

## 2017-08-18 ENCOUNTER — Encounter: Payer: Self-pay | Admitting: Dietician

## 2017-08-18 ENCOUNTER — Encounter: Payer: BC Managed Care – PPO | Attending: Family Medicine | Admitting: Dietician

## 2017-08-18 VITALS — BP 140/90 | Ht 68.0 in | Wt 317.9 lb

## 2017-08-18 DIAGNOSIS — E1165 Type 2 diabetes mellitus with hyperglycemia: Secondary | ICD-10-CM

## 2017-08-18 DIAGNOSIS — E119 Type 2 diabetes mellitus without complications: Secondary | ICD-10-CM | POA: Insufficient documentation

## 2017-08-18 NOTE — Progress Notes (Signed)

## 2017-08-26 ENCOUNTER — Encounter: Payer: Self-pay | Admitting: *Deleted

## 2017-09-25 ENCOUNTER — Other Ambulatory Visit: Payer: Self-pay | Admitting: Family Medicine

## 2017-09-25 DIAGNOSIS — E119 Type 2 diabetes mellitus without complications: Secondary | ICD-10-CM

## 2017-10-29 IMAGING — US US EXTREM LOW VENOUS*R*
1 series · 13 of 24 positions shown · non-contrast
Comparison: None.

CLINICAL DATA: Right lower extremity cellulitis for 2 weeks. Pain
and edema with discoloration.



[Series 1: us extrem low venous*right* · 0.08mm/px · 13 of 31 slices shown]
[im 1/31]
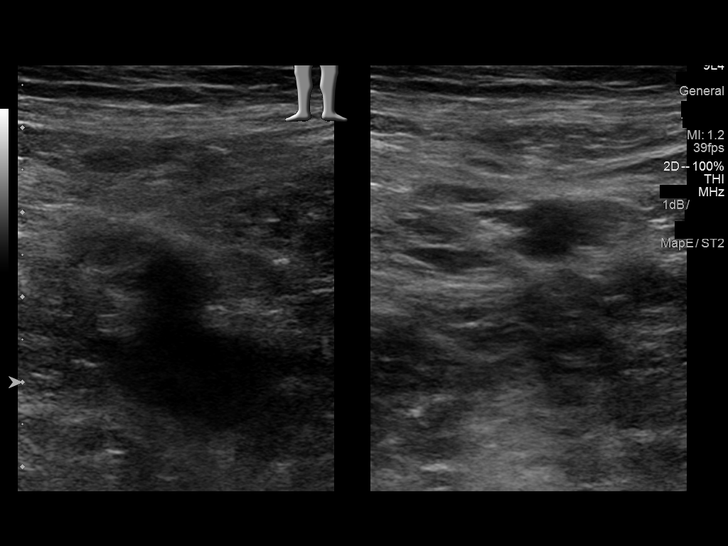
[im 3/31]
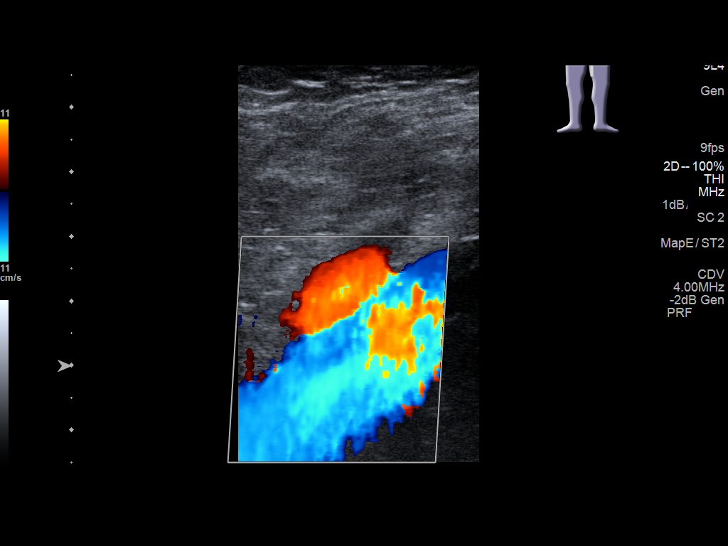
[im 6/31]
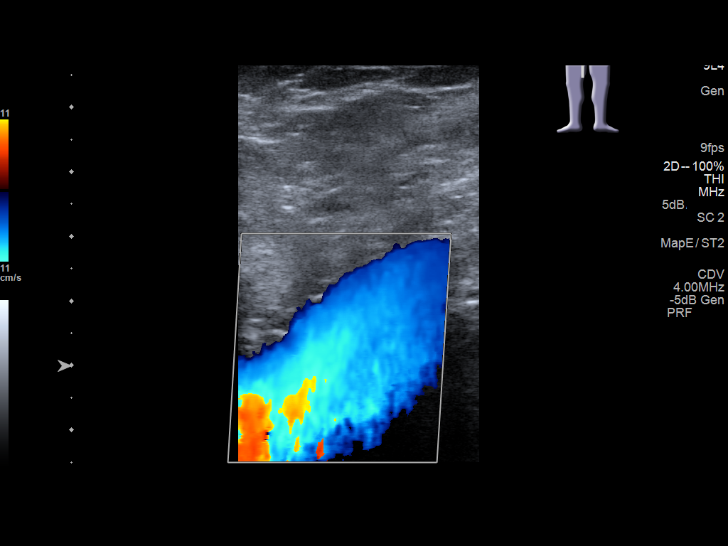
[im 8/31]
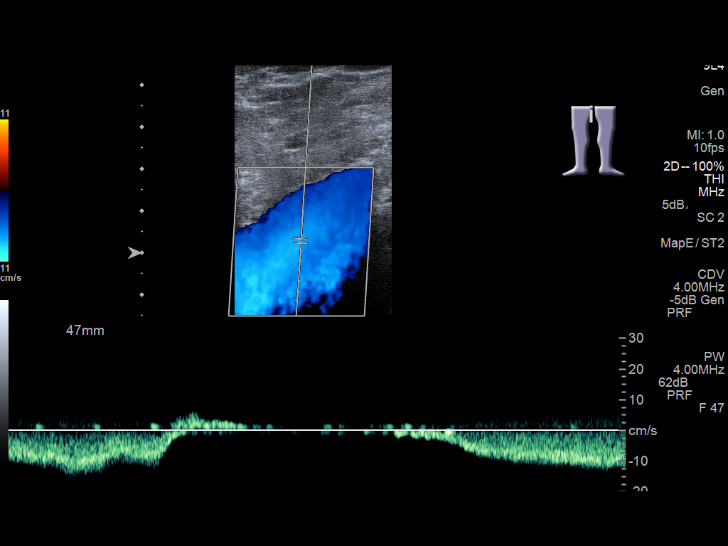
[im 11/31]
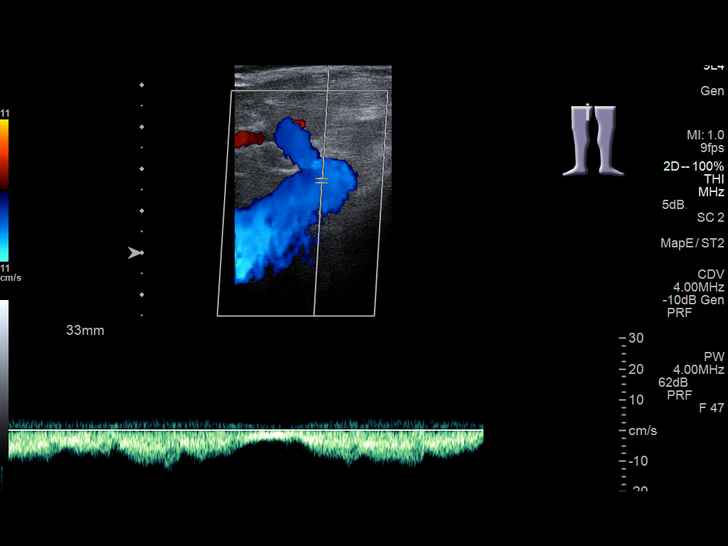
[im 14/31]
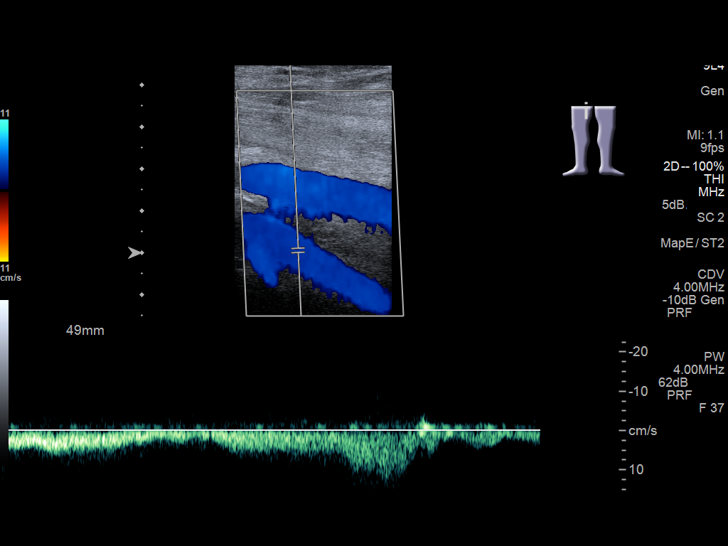
[im 16/31]
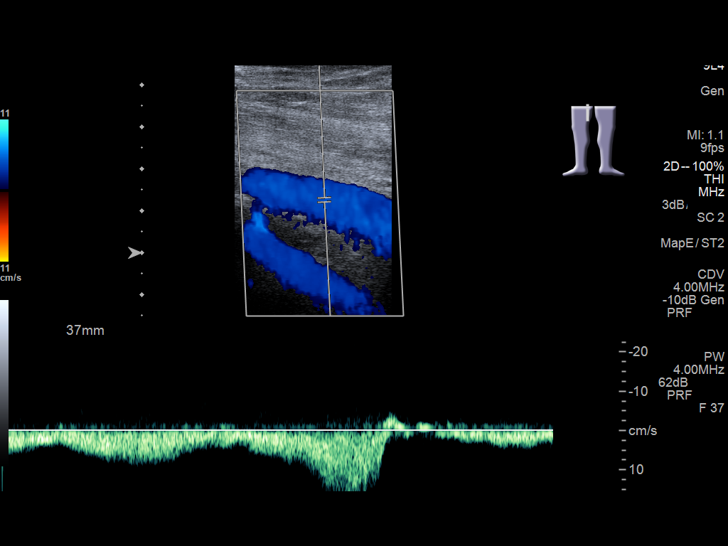
[im 17/31]
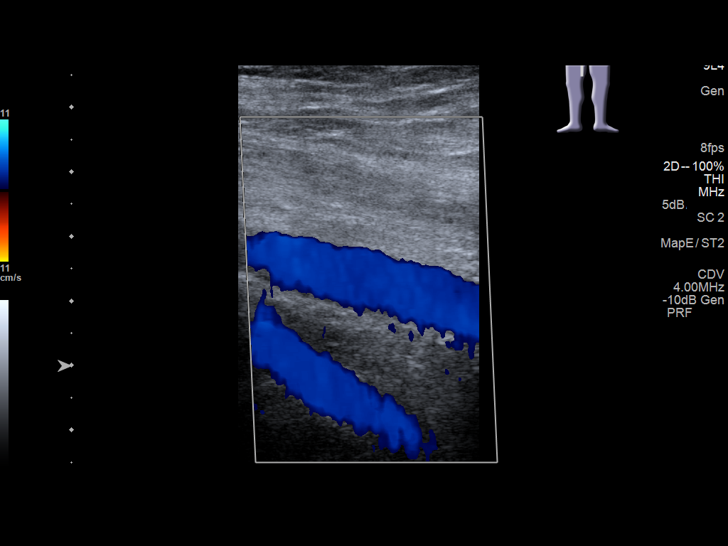
[im 20/31]
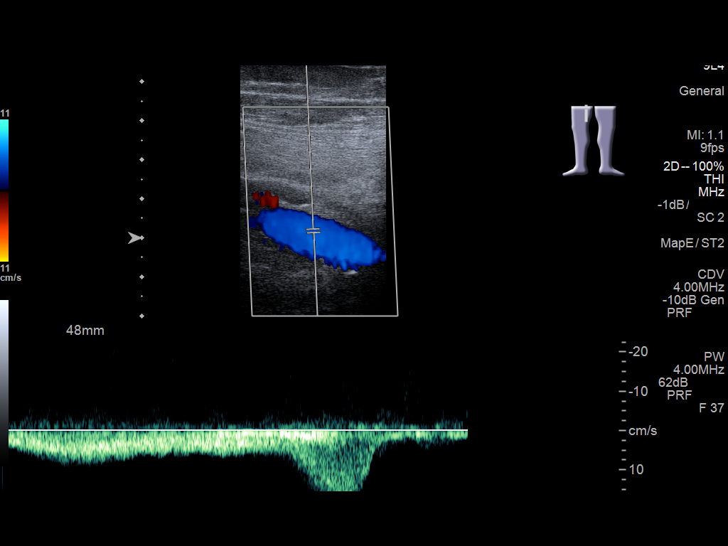
[im 23/31]
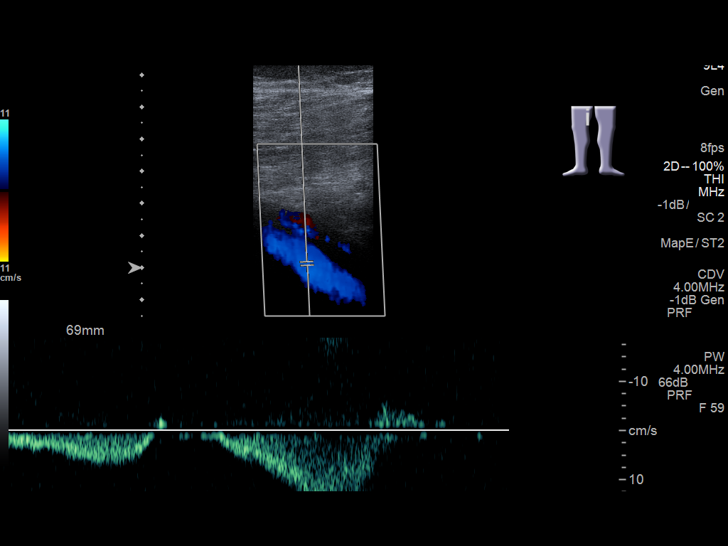
[im 25/31]
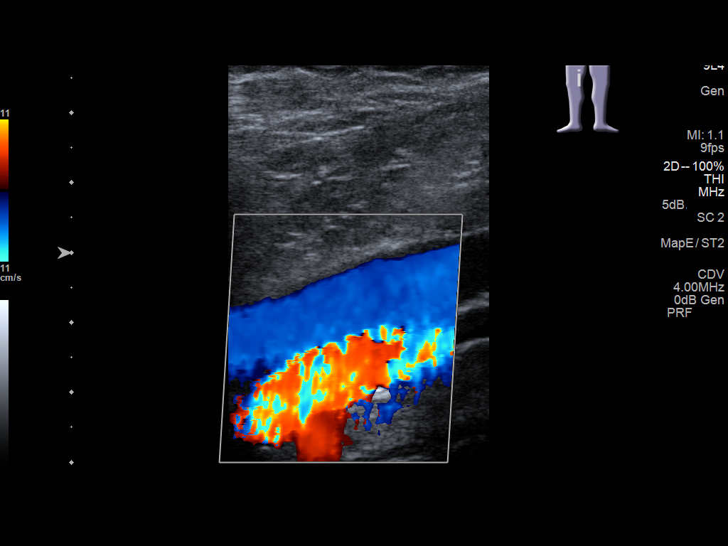
[im 28/31]
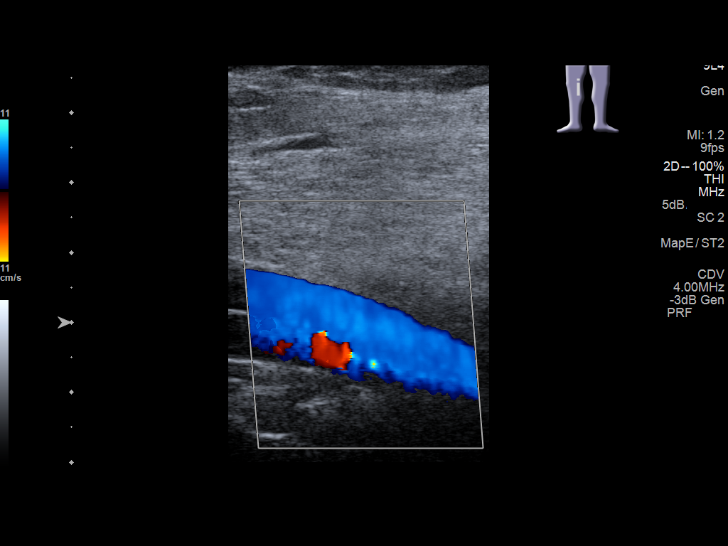
[im 31/31]
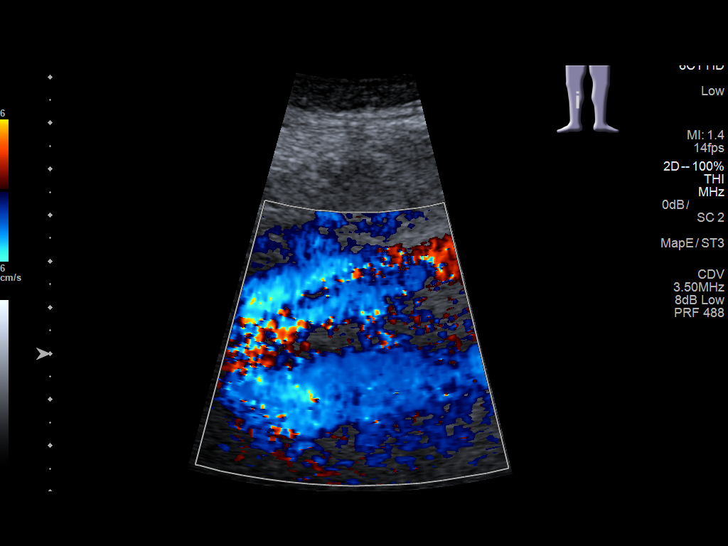

[13 of 24 positions shown; findings below may reference images not displayed]

FINDINGS: Contralateral Common Femoral Vein: Respiratory phasicity is normal
and symmetric with the symptomatic side. No evidence of thrombus.
Normal compressibility.

Common Femoral Vein: No evidence of thrombus. Normal
compressibility, respiratory phasicity and response to augmentation.

Saphenofemoral Junction: No evidence of thrombus. Normal
compressibility and flow on color Doppler imaging.

Profunda Femoral Vein: No evidence of thrombus. Normal
compressibility and flow on color Doppler imaging.

Femoral Vein: No evidence of thrombus. Normal compressibility,
respiratory phasicity and response to augmentation.

Popliteal Vein: No evidence of thrombus. Normal compressibility,
respiratory phasicity and response to augmentation.

Calf Veins: No evidence of thrombus. Normal compressibility and flow
on color Doppler imaging.

Superficial Great Saphenous Vein: No evidence of thrombus. Normal
compressibility and flow on color Doppler imaging.

Venous Reflux:  None.

Other Findings:  None.
IMPRESSION: No evidence of deep venous thrombosis.

## 2017-11-05 ENCOUNTER — Encounter: Payer: Self-pay | Admitting: Family Medicine

## 2017-11-05 ENCOUNTER — Ambulatory Visit: Payer: BC Managed Care – PPO | Admitting: Family Medicine

## 2017-11-05 VITALS — BP 138/98 | HR 77 | Temp 98.1°F | Resp 16 | Wt 325.0 lb

## 2017-11-05 DIAGNOSIS — E119 Type 2 diabetes mellitus without complications: Secondary | ICD-10-CM

## 2017-11-05 LAB — POCT GLYCOSYLATED HEMOGLOBIN (HGB A1C)
Est. average glucose Bld gHb Est-mCnc: 148
Hemoglobin A1C: 6.8

## 2017-11-05 LAB — POCT UA - MICROALBUMIN: Microalbumin Ur, POC: 20 mg/L

## 2017-11-05 NOTE — Patient Instructions (Addendum)
   Please go to the lab draw station [address] on the second floor of [facility]   Please contact your eyecare professional to schedule a routine eye exam

## 2017-11-05 NOTE — Progress Notes (Signed)
Patient: Adam Wolfe Male    DOB: Jul 05, 1966   52 y.o.   MRN: [id number] Visit Date: 11/05/2017  Today's Provider: Lelon Huh, MD   Chief Complaint  Patient presents with  . Diabetes    follow up   Subjective:    HPI   Diabetes Mellitus Type II, Follow-up:   Lab Results  Component Value Date   HGBA1C 8.8 08/05/2017   HGBA1C 10.3 07/03/2017   Last seen for diabetes 3 months ago.  Management since then includes continue same medications and continue lifestyle classes. He reports good compliance with treatment. He is not having side effects.  Current symptoms include none and have been stable. Home blood sugar records: randon glucose range 130-140  Episodes of hypoglycemia? no   Current Insulin Regimen: none Most Recent Eye Exam: >1 year ago Weight trend: increasing steadily Prior visit with dietician: yes - 08/2017 Current diet: in general, a "healthy" diet   Current exercise: none  Wt Readings from Last 3 Encounters:  11/05/17 (!) 325 lb (147.4 kg)  08/18/17 (!) 317 lb 14.4 oz (144.2 kg)  08/11/17 (!) 318 lb 4.8 oz (144.4 kg)    ------------------------------------------------------------------------    Allergies  Allergen Reactions  . Lasix [Furosemide] Hives     Current Outpatient Medications:  .  allopurinol (ZYLOPRIM) 300 MG tablet, TAKE 1 TABLET BY MOUTH DAILY, Disp: 30 tablet, Rfl: 11 .  glipiZIDE (GLUCOTROL XL) 2.5 MG 24 hr tablet, TAKE 1 TABLET (2.5 MG TOTAL) BY MOUTH DAILY WITH BREAKFAST., Disp: 30 tablet, Rfl: 11 .  glucose blood test strip, 1 each by Other route as needed for other. Use as instructed, Disp: 100 each, Rfl: 3 .  metFORMIN (GLUCOPHAGE-XR) 500 MG 24 hr tablet, TAKE 1 TABLET (500 MG TOTAL) BY MOUTH EVERY EVENING., Disp: 30 tablet, Rfl: 11 .  ONETOUCH DELICA LANCETS FINE MISC, Use to check blood sugar daily, Disp: 100 each, Rfl: 3 .  vitamin B-12 (CYANOCOBALAMIN) 1000 MCG tablet, Take 1,000 mcg by mouth daily., Disp: ,  Rfl:   Review of Systems  Constitutional: Negative for appetite change, chills and fever.  Respiratory: Negative for chest tightness, shortness of breath and wheezing.   Cardiovascular: Negative for chest pain and palpitations.  Gastrointestinal: Negative for abdominal pain, nausea and vomiting.  Neurological: Positive for numbness (in feet and toes). Negative for seizures.    Social History   Tobacco Use  . Smoking status: Never Smoker  . Smokeless tobacco: Current User    Types: Chew  Substance Use Topics  . Alcohol use: Yes    Alcohol/week: 0.0 oz   Objective:   BP (!) 138/98 (BP Location: Right Arm, Cuff Size: Large)   Pulse 77   Temp 98.1 F (36.7 C) (Oral)   Resp 16   Wt (!) 325 lb (147.4 kg)   BMI 49.42 kg/m  Vitals:   11/05/17 1629 11/05/17 1638  BP: (!) 138/100 (!) 138/98  Pulse: 77   Resp: 16   Temp: 98.1 F (36.7 C)   TempSrc: Oral   Weight: (!) 325 lb (147.4 kg)      Physical Exam  General Appearance:    Alert, cooperative, no distress, obese  Eyes:    PERRL, conjunctiva/corneas clear, EOM's intact       Lungs:     Clear to auscultation bilaterally, respirations unlabored  Heart:    Regular rate and rhythm  Neurologic:   Awake, alert, oriented x 3. No apparent focal neurological  defect.         Results for orders placed or performed in visit on 11/05/17  POCT HgB A1C  Result Value Ref Range   Hemoglobin A1C 6.8    Est. average glucose Bld gHb Est-mCnc 148   POCT UA - Microalbumin  Result Value Ref Range   Microalbumin Ur, POC 20 mg/L   Creatinine, POC n/a mg/dL   Albumin/Creatinine Ratio, Urine, POC n/a        Assessment & Plan:     1. Diabetes mellitus without complication ([place])  - POCT HgB A1C - POCT UA - Microalbumin - Lipid panel - Comprehensive metabolic panel  M3O much better. Counseled regarding prudent diet and regular exercise. Continue current medications.  If labs normal will arrange follow up 4-5 months.         Lelon Huh, MD  [facility]

## 2017-11-12 DIAGNOSIS — E119 Type 2 diabetes mellitus without complications: Secondary | ICD-10-CM | POA: Diagnosis not present

## 2017-11-13 LAB — LIPID PANEL
CHOLESTEROL TOTAL: 152 mg/dL (ref 100–199)
Chol/HDL Ratio: 5.4 ratio — ABNORMAL HIGH (ref 0.0–5.0)
HDL: 28 mg/dL — ABNORMAL LOW (ref >39)
LDL Calculated: 59 mg/dL (ref 0–99)
Triglycerides: 326 mg/dL — ABNORMAL HIGH (ref 0–149)
VLDL Cholesterol Cal: 65 mg/dL — ABNORMAL HIGH (ref 5–40)

## 2017-11-13 LAB — COMPREHENSIVE METABOLIC PANEL
ALBUMIN: 3.9 g/dL (ref 3.5–5.5)
ALK PHOS: 65 IU/L (ref 39–117)
ALT: 38 IU/L (ref 0–44)
AST: 33 IU/L (ref 0–40)
Albumin/Globulin Ratio: 1.4 (ref 1.2–2.2)
BUN / CREAT RATIO: 11 (ref 9–20)
BUN: 12 mg/dL (ref 6–24)
Bilirubin Total: 0.3 mg/dL (ref 0.0–1.2)
CALCIUM: 8.9 mg/dL (ref 8.7–10.2)
CO2: 19 mmol/L — AB (ref 20–29)
CREATININE: 1.14 mg/dL (ref 0.76–1.27)
Chloride: 103 mmol/L (ref 96–106)
GFR, EST AFRICAN AMERICAN: 87 mL/min/{1.73_m2} (ref >59)
GFR, EST NON AFRICAN AMERICAN: 75 mL/min/{1.73_m2} (ref >59)
GLOBULIN, TOTAL: 2.7 g/dL (ref 1.5–4.5)
GLUCOSE: 137 mg/dL — AB (ref 65–99)
Potassium: 4.5 mmol/L (ref 3.5–5.2)
Sodium: 142 mmol/L (ref 134–144)
TOTAL PROTEIN: 6.6 g/dL (ref 6.0–8.5)

## 2017-11-25 ENCOUNTER — Other Ambulatory Visit: Payer: Self-pay | Admitting: Family Medicine

## 2018-01-29 LAB — HM DIABETES EYE EXAM

## 2018-02-11 ENCOUNTER — Encounter: Payer: Self-pay | Admitting: *Deleted

## 2018-05-25 ENCOUNTER — Ambulatory Visit: Payer: BLUE CROSS/BLUE SHIELD | Admitting: Family Medicine

## 2018-05-25 ENCOUNTER — Encounter: Payer: Self-pay | Admitting: Family Medicine

## 2018-05-25 VITALS — BP 137/96 | HR 85 | Temp 98.8°F | Resp 20 | Wt 323.0 lb

## 2018-05-25 DIAGNOSIS — Z1211 Encounter for screening for malignant neoplasm of colon: Secondary | ICD-10-CM | POA: Diagnosis not present

## 2018-05-25 DIAGNOSIS — G4733 Obstructive sleep apnea (adult) (pediatric): Secondary | ICD-10-CM | POA: Diagnosis not present

## 2018-05-25 DIAGNOSIS — E119 Type 2 diabetes mellitus without complications: Secondary | ICD-10-CM | POA: Diagnosis not present

## 2018-05-25 LAB — POCT GLYCOSYLATED HEMOGLOBIN (HGB A1C)
Est. average glucose Bld gHb Est-mCnc: 183
Hemoglobin A1C: 8 % — AB (ref 4.0–5.6)

## 2018-05-25 MED ORDER — METFORMIN HCL ER 500 MG PO TB24: 1000.0000 mg | ORAL_TABLET | Freq: Every evening | ORAL | 3 refills | Status: DC

## 2018-05-25 NOTE — Progress Notes (Signed)
Patient: Adam Wolfe Male    DOB: June 06, 1966   52 y.o.   MRN: [id number] Visit Date: 05/25/2018  Today's Provider: Lelon Huh, MD   Chief Complaint  Patient presents with  . Diabetes  . Sleep Apnea   Subjective:    HPI  Diabetes Mellitus Type II, Follow-up:   Lab Results  Component Value Date   HGBA1C 6.8 11/05/2017   HGBA1C 8.8 08/05/2017   HGBA1C 10.3 07/03/2017    Last seen for diabetes 5 months ago.  Management since then includes counseling patient of prudent diet and regular exercise. He reports good compliance with treatment. He is not having side effects.  Current symptoms include paresthesia of the feet and have been stable. Home blood sugar records: patient rarely checks his blood sugars  Episodes of hypoglycemia? no   Current Insulin Regimen: none Most Recent Eye Exam: 01/29/2018 Weight trend: stable Prior visit with dietician: no Current diet: in general, an "unhealthy" diet Current exercise: none  Pertinent Labs:    Component Value Date/Time   CHOL 152 11/12/2017 0815   TRIG 326 (H) 11/12/2017 0815   HDL 28 (L) 11/12/2017 0815   LDLCALC 59 11/12/2017 0815   CREATININE 1.14 11/12/2017 0815   CREATININE 1.09 07/03/2017 1203    Wt Readings from Last 3 Encounters:  05/25/18 (!) 323 lb (146.5 kg)  11/05/17 (!) 325 lb (147.4 kg)  08/18/17 (!) 317 lb 14.4 oz (144.2 kg)    ------------------------------------------------------------------------ Obstructive Sleep Apnea: Patient was last seen for this problem 1 year ago. Management during that visit includes placing order for new CPAP and equipment due to aging equipment which he has been using since 2005. His current equipment is not compatible with requires compliance reports and he needs this to replace his masks and hoses. He is complaint with machine and uses every night and cannot sleep without it.     Allergies  Allergen Reactions  . Lasix [Furosemide] Hives     Current  Outpatient Medications:  .  allopurinol (ZYLOPRIM) 300 MG tablet, TAKE 1 TABLET (300 MG TOTAL) BY MOUTH DAILY., Disp: 90 tablet, Rfl: 4 .  glipiZIDE (GLUCOTROL XL) 2.5 MG 24 hr tablet, TAKE 1 TABLET (2.5 MG TOTAL) BY MOUTH DAILY WITH BREAKFAST., Disp: 30 tablet, Rfl: 11 .  glucose blood test strip, 1 each by Other route as needed for other. Use as instructed, Disp: 100 each, Rfl: 3 .  metFORMIN (GLUCOPHAGE-XR) 500 MG 24 hr tablet, TAKE 1 TABLET (500 MG TOTAL) BY MOUTH EVERY EVENING., Disp: 30 tablet, Rfl: 11 .  ONETOUCH DELICA LANCETS FINE MISC, Use to check blood sugar daily, Disp: 100 each, Rfl: 3 .  vitamin B-12 (CYANOCOBALAMIN) 1000 MCG tablet, Take 1,000 mcg by mouth daily., Disp: , Rfl:   Review of Systems  Constitutional: Negative for appetite change, chills and fever.  Respiratory: Negative for chest tightness, shortness of breath and wheezing.   Cardiovascular: Negative for chest pain and palpitations.  Gastrointestinal: Negative for abdominal pain, nausea and vomiting.  Neurological: Positive for numbness (in feet).    Social History   Tobacco Use  . Smoking status: Never Smoker  . Smokeless tobacco: Current User    Types: Chew  Substance Use Topics  . Alcohol use: Not Currently    Alcohol/week: 0.0 standard drinks   Objective:   BP (!) 137/96 (BP Location: Left Arm, Patient Position: Sitting, Cuff Size: Large)   Pulse 85   Temp 98.8 F (37.1 C) (Oral)  Resp 20   Wt (!) 323 lb (146.5 kg)   SpO2 97% Comment: room air  BMI 49.11 kg/m  Vitals:   05/25/18 1427  BP: (!) 137/96  Pulse: 85  Resp: 20  Temp: 98.8 F (37.1 C)  TempSrc: Oral  SpO2: 97%  Weight: (!) 323 lb (146.5 kg)     Physical Exam  General Appearance:    Alert, cooperative, no distress, obese  Eyes:    PERRL, conjunctiva/corneas clear, EOM's intact       Lungs:     Clear to auscultation bilaterally, respirations unlabored  Heart:    Regular rate and rhythm  Neurologic:   Awake, alert,  oriented x 3. No apparent focal neurological           defect.       Results for orders placed or performed in visit on 05/25/18  POCT HgB A1C  Result Value Ref Range   Hemoglobin A1C 8.0 (A) 4.0 - 5.6 %   HbA1c POC (<> result, manual entry)     HbA1c, POC (prediabetic range)     HbA1c, POC (controlled diabetic range)     Est. average glucose Bld gHb Est-mCnc 183        Assessment & Plan:     1. Diabetes mellitus without complication ([place]) Uncontrolled and worsening, counseled on prudent diet and increase exercise as able. Double metformin to BID.  - POCT HgB A1C - metFORMIN (GLUCOPHAGE-XR) 500 MG 24 hr tablet; Take 2 tablets (1,000 mg total) by mouth every evening.  Dispense: 60 tablet; Refill: 3  2. Obstructive sleep apnea Has been compliant with CPAP, but current machine is over 17 years old and he cannot get supplies for it. He needs new machine and supplies and is currently using 13cm water pressure with humidity.   3. Colon cancer screening  - Ambulatory referral to Gastroenterology  Return in about 3 months (around 08/25/2018) for diabetes.           Lelon Huh, MD  [facility]

## 2018-05-27 ENCOUNTER — Telehealth: Payer: Self-pay | Admitting: Family Medicine

## 2018-05-27 NOTE — Telephone Encounter (Signed)
Patient needs order for CPAP and office note from 05-25-2018 faxed. Can you please check with patient and see which company he wants this sent to. I think he uses [facility], but not sure. Thanks.

## 2018-05-28 NOTE — Telephone Encounter (Signed)
Order was faxed to [facility].

## 2018-05-28 NOTE — Telephone Encounter (Signed)
Patient uses [facility].

## 2018-05-29 ENCOUNTER — Telehealth: Payer: Self-pay | Admitting: Family Medicine

## 2018-05-29 NOTE — Telephone Encounter (Signed)
Office note faxed to number listed below. I called Estill Bamberg from [facility] and advised her of this.

## 2018-05-29 NOTE — Telephone Encounter (Signed)
Estill Bamberg with [facility] called needing office notes to comply with the order for a new C-pap machine for patient.  Can you fax to [phone]  Thanks teri

## 2018-06-01 ENCOUNTER — Other Ambulatory Visit: Payer: Self-pay

## 2018-06-01 ENCOUNTER — Telehealth: Payer: Self-pay | Admitting: Gastroenterology

## 2018-06-01 DIAGNOSIS — Z1211 Encounter for screening for malignant neoplasm of colon: Secondary | ICD-10-CM

## 2018-06-01 NOTE — Telephone Encounter (Signed)
Gastroenterology Pre-Procedure Review  Request Date: 07/24/18   [facility] Requesting Physician: Dr. Vicente Males  PATIENT REVIEW QUESTIONS: The patient responded to the following health history questions as indicated:    1. Are you having any GI issues? no 2. Do you have a personal history of Polyps? no 3. Do you have a family history of Colon Cancer or Polyps? yes (Father CC) 4. Diabetes Mellitus? yes (Type II) 5. Joint replacements in the past 12 months?no 6. Major health problems in the past 3 months?no 7. Any artificial heart valves, MVP, or defibrillator?no    MEDICATIONS & ALLERGIES:    Patient reports the following regarding taking any anticoagulation/antiplatelet therapy:   Plavix, Coumadin, Eliquis, Xarelto, Lovenox, Pradaxa, Brilinta, or Effient? no Aspirin? no  Patient confirms/reports the following medications:  Current Outpatient Medications  Medication Sig Dispense Refill  . allopurinol (ZYLOPRIM) 300 MG tablet TAKE 1 TABLET (300 MG TOTAL) BY MOUTH DAILY. 90 tablet 4  . glipiZIDE (GLUCOTROL XL) 2.5 MG 24 hr tablet TAKE 1 TABLET (2.5 MG TOTAL) BY MOUTH DAILY WITH BREAKFAST. 30 tablet 11  . glucose blood test strip 1 each by Other route as needed for other. Use as instructed 100 each 3  . metFORMIN (GLUCOPHAGE-XR) 500 MG 24 hr tablet Take 2 tablets (1,000 mg total) by mouth every evening. 60 tablet 3  . ONETOUCH DELICA LANCETS FINE MISC Use to check blood sugar daily 100 each 3  . vitamin B-12 (CYANOCOBALAMIN) 1000 MCG tablet Take 1,000 mcg by mouth daily.     No current facility-administered medications for this visit.     Patient confirms/reports the following allergies:  Allergies  Allergen Reactions  . Lasix [Furosemide] Hives    No orders of the defined types were placed in this encounter.   AUTHORIZATION INFORMATION Primary Insurance: 1D#: Group #:  Secondary Insurance: 1D#: Group #:  SCHEDULE INFORMATION: Date: 07/24/18      Vicente Males Time: Location: [place]

## 2018-06-11 ENCOUNTER — Telehealth: Payer: Self-pay | Admitting: Family Medicine

## 2018-06-11 NOTE — Telephone Encounter (Signed)
Received message from [place] at [facility] [phone]) that patient is in collections and they will not help him get new CPAP. Please check with patient to see if this has been straightened out, or if he needs order sent somewhere else such as [facility])

## 2018-06-11 NOTE — Telephone Encounter (Signed)
LMOVM for pt to return call 

## 2018-06-18 NOTE — Telephone Encounter (Signed)
LMOVM for pt to return call 

## 2018-06-25 NOTE — Telephone Encounter (Signed)
I called and spoke with patient, who states that this issue has been resolved. He has been in contact with [facility] and they are working on getting the CPAP out to him.

## 2018-07-14 ENCOUNTER — Encounter: Payer: Self-pay | Admitting: Family Medicine

## 2018-07-14 ENCOUNTER — Ambulatory Visit: Payer: BLUE CROSS/BLUE SHIELD | Admitting: Family Medicine

## 2018-07-14 VITALS — BP 132/90 | HR 100 | Temp 98.1°F | Resp 18 | Wt 324.2 lb

## 2018-07-14 DIAGNOSIS — J069 Acute upper respiratory infection, unspecified: Secondary | ICD-10-CM | POA: Diagnosis not present

## 2018-07-14 NOTE — Progress Notes (Signed)
  Subjective:     Patient ID: Adam Wolfe, male   DOB: 17-Jul-1966, 52 y.o.   MRN: [id number] Chief Complaint  Patient presents with  . URI    Patient comes in today with congestion , fatigue, hoarness, no sore throat, dry cough, post nasal drainage for 6 days. Patient states he has been taking otc [place] HBP.   HPI Minimal sinus congestion and no fever or chills. PND triggering cough.  Review of Systems     Objective:   Physical Exam  Constitutional: He appears well-developed and well-nourished. No distress.  Ears: T.M's intact without inflammation Throat: no tonsillar enlargement or exudate Neck: no cervical adenopathy Lungs: clear     Assessment:    1. URI, acute    Plan:    Discussed use of Benadryl at bedtime and Delsym for cough. Continue Coricidin. Call at the end of the week if not improving.

## 2018-07-14 NOTE — Patient Instructions (Signed)
Continue Coricidin for high bp. Add Benadryl at night to help with postnasal drainage. May use Delsym for cough.

## 2018-07-21 ENCOUNTER — Telehealth: Payer: Self-pay | Admitting: Gastroenterology

## 2018-07-21 NOTE — Telephone Encounter (Signed)
Patient called today and has questions about the sup rep and has question taking with having the gout. Please CALL [phone]. procedure SCHEDULED for Friday.

## 2018-07-22 ENCOUNTER — Telehealth: Payer: Self-pay

## 2018-07-22 NOTE — Telephone Encounter (Signed)
LVM with patient to address his concern regarding Suprep.  Informed patient that Suprep is okay for him to use if he has Gout-advised by Dr. Vicente Males.  Thanks [employer]

## 2018-07-23 ENCOUNTER — Encounter: Payer: Self-pay | Admitting: *Deleted

## 2018-07-24 ENCOUNTER — Ambulatory Visit: Payer: BLUE CROSS/BLUE SHIELD | Admitting: Anesthesiology

## 2018-07-24 ENCOUNTER — Ambulatory Visit
Admission: RE | Admit: 2018-07-24 | Discharge: 2018-07-24 | Disposition: A | Discharge status: Home / Self Care | Payer: BLUE CROSS/BLUE SHIELD | Source: Ambulatory Visit | Attending: Gastroenterology | Admitting: Gastroenterology

## 2018-07-24 ENCOUNTER — Encounter
Admission: RE | Disposition: A | Discharge status: Home / Self Care | Payer: Self-pay | Source: Ambulatory Visit | Attending: Gastroenterology

## 2018-07-24 ENCOUNTER — Encounter: Payer: Self-pay | Admitting: Anesthesiology

## 2018-07-24 DIAGNOSIS — Z800 Family history of malignant neoplasm of digestive organs: Secondary | ICD-10-CM | POA: Diagnosis not present

## 2018-07-24 DIAGNOSIS — G4733 Obstructive sleep apnea (adult) (pediatric): Secondary | ICD-10-CM | POA: Diagnosis not present

## 2018-07-24 DIAGNOSIS — Z1211 Encounter for screening for malignant neoplasm of colon: Secondary | ICD-10-CM | POA: Diagnosis not present

## 2018-07-24 DIAGNOSIS — M109 Gout, unspecified: Secondary | ICD-10-CM | POA: Diagnosis not present

## 2018-07-24 DIAGNOSIS — D125 Benign neoplasm of sigmoid colon: Secondary | ICD-10-CM | POA: Insufficient documentation

## 2018-07-24 DIAGNOSIS — Z7984 Long term (current) use of oral hypoglycemic drugs: Secondary | ICD-10-CM | POA: Insufficient documentation

## 2018-07-24 DIAGNOSIS — E119 Type 2 diabetes mellitus without complications: Secondary | ICD-10-CM | POA: Insufficient documentation

## 2018-07-24 DIAGNOSIS — K635 Polyp of colon: Secondary | ICD-10-CM | POA: Diagnosis not present

## 2018-07-24 DIAGNOSIS — D120 Benign neoplasm of cecum: Secondary | ICD-10-CM | POA: Diagnosis not present

## 2018-07-24 DIAGNOSIS — D126 Benign neoplasm of colon, unspecified: Secondary | ICD-10-CM | POA: Diagnosis not present

## 2018-07-24 DIAGNOSIS — D122 Benign neoplasm of ascending colon: Secondary | ICD-10-CM | POA: Insufficient documentation

## 2018-07-24 DIAGNOSIS — K5730 Diverticulosis of large intestine without perforation or abscess without bleeding: Secondary | ICD-10-CM | POA: Insufficient documentation

## 2018-07-24 DIAGNOSIS — Z79899 Other long term (current) drug therapy: Secondary | ICD-10-CM | POA: Diagnosis not present

## 2018-07-24 DIAGNOSIS — K5790 Diverticulosis of intestine, part unspecified, without perforation or abscess without bleeding: Secondary | ICD-10-CM | POA: Diagnosis not present

## 2018-07-24 HISTORY — PX: COLONOSCOPY WITH PROPOFOL: SHX5780

## 2018-07-24 LAB — GLUCOSE, CAPILLARY: Glucose-Capillary: 157 mg/dL — ABNORMAL HIGH (ref 70–99)

## 2018-07-24 SURGERY — COLONOSCOPY WITH PROPOFOL
Anesthesia: General

## 2018-07-24 MED ORDER — PROPOFOL 10 MG/ML IV BOLUS
INTRAVENOUS | Status: DC | PRN
  Administered 2018-07-24: 480 mg via INTRAVENOUS

## 2018-07-24 MED ORDER — PROPOFOL 500 MG/50ML IV EMUL
INTRAVENOUS | Status: DC | PRN
  Administered 2018-07-24: 480 ug/kg/min via INTRAVENOUS

## 2018-07-24 MED ORDER — LIDOCAINE HCL (PF) 1 % IJ SOLN
2.0000 mL | Freq: Once | INTRAMUSCULAR | Status: AC
  Administered 2018-07-24: 0.3 mL via INTRADERMAL

## 2018-07-24 MED ORDER — LACTATED RINGERS IV SOLN
INTRAVENOUS | Status: DC | PRN
  Administered 2018-07-24: 09:00:00 via INTRAVENOUS

## 2018-07-24 MED ORDER — LIDOCAINE HCL (PF) 1 % IJ SOLN
INTRAMUSCULAR | Status: AC
  Administered 2018-07-24: 0.3 mL via INTRADERMAL
  Filled 2018-07-24: qty 2

## 2018-07-24 MED ORDER — SODIUM CHLORIDE 0.9 % IV SOLN
INTRAVENOUS | Status: DC
  Administered 2018-07-24: 1000 mL via INTRAVENOUS

## 2018-07-24 NOTE — Anesthesia Post-op Follow-up Note (Signed)
Anesthesia QCDR form completed.        

## 2018-07-24 NOTE — Op Note (Signed)
[facility] Patient Name: Adam Wolfe Procedure Date: 07/24/2018 7:35 AM MRN: [id number] Account #: [id number] Date of Birth: 05/03/1966 Admit Type: Outpatient Age: 52 Room: [facility] ENDO ROOM 4 Gender: Male Note Status: Finalized Procedure:            Colonoscopy Indications:          Screening in patient at increased risk: Family history                        of 1st-degree relative with colorectal cancer Providers:            Jonathon Bellows MD, MD Referring MD:         Kirstie Peri. Caryn Section, MD (Referring MD) Medicines:            Monitored Anesthesia Care Complications:        No immediate complications. Procedure:            Pre-Anesthesia Assessment:                       - Prior to the procedure, a History and Physical was                        performed, and patient medications, allergies and                        sensitivities were reviewed. The patient's tolerance of                        previous anesthesia was reviewed.                       - The risks and benefits of the procedure and the                        sedation options and risks were discussed with the                        patient. All questions were answered and informed                        consent was obtained.                       - After reviewing the risks and benefits, the patient                        was deemed in satisfactory condition to undergo the                        procedure.                       - ASA Grade Assessment: II - A patient with mild                        systemic disease.                       After obtaining informed consent, the colonoscope was  passed under direct vision. Throughout the procedure,                        the patient's blood pressure, pulse, and oxygen                        saturations were monitored continuously. The                        Colonoscope was introduced through the anus and             advanced to the the cecum, identified by the                        appendiceal orifice, IC valve and transillumination.                        The colonoscopy was performed with ease. The patient                        tolerated the procedure well. The quality of the bowel                        preparation was good. Findings:      Multiple small-mouthed diverticula were found in the sigmoid colon.      Ten sessile polyps were found in the cecum. The polyps were 3 to 5 mm in       size. These polyps were removed with a cold biopsy forceps. Resection       and retrieval were complete.      A 5 mm polyp was found in the cecum. The polyp was sessile. The polyp       was removed with a cold snare. Resection and retrieval were complete.      Four sessile polyps were found in the ascending colon. The polyps were 5       to 7 mm in size. These polyps were removed with a cold snare. Resection       and retrieval were complete.      Two sessile polyps were found in the sigmoid colon. The polyps were 5 to       6 mm in size. These polyps were removed with a cold snare. Resection and       retrieval were complete.      The exam was otherwise without abnormality on direct and retroflexion       views. Impression:           - Diverticulosis in the sigmoid colon.                       - Ten 3 to 5 mm polyps in the cecum, removed with a                        cold biopsy forceps. Resected and retrieved.                       - One 5 mm polyp in the cecum, removed with a cold                        snare. Resected and retrieved.                       -  Four 5 to 7 mm polyps in the ascending colon, removed                        with a cold snare. Resected and retrieved.                       - Two 5 to 6 mm polyps in the sigmoid colon, removed                        with a cold snare. Resected and retrieved.                       - The examination was otherwise normal on direct and                         retroflexion views. Recommendation:       - Discharge patient to home (with escort).                       - Resume previous diet.                       - Continue present medications.                       - Await pathology results.                       - Repeat colonoscopy in 1 year for surveillance.                       - Refer to genetic testing to evaluate for attenuated                        FAP Procedure Code(s):    --- Professional ---                       [phone], Colonoscopy, flexible; with removal of tumor(s),                        polyp(s), or other lesion(s) by snare technique                       45380, 59, Colonoscopy, flexible; with biopsy, single                        or multiple Diagnosis Code(s):    --- Professional ---                       Z80.0, Family history of malignant neoplasm of                        digestive organs                       D12.0, Benign neoplasm of cecum                       D12.2, Benign neoplasm of ascending colon                       D12.5,  Benign neoplasm of sigmoid colon                       K57.30, Diverticulosis of large intestine without                        perforation or abscess without bleeding CPT copyright 2018 American Medical Association. All rights reserved. The codes documented in this report are preliminary and upon coder review may  be revised to meet current compliance requirements. Jonathon Bellows, MD Jonathon Bellows MD, MD 07/24/2018 8:50:31 AM This report has been signed electronically. Number of Addenda: 0 Note Initiated On: 07/24/2018 7:35 AM Scope Withdrawal Time: 0 hours 26 minutes 59 seconds  Total Procedure Duration: 0 hours 30 minutes 21 seconds       [facility]

## 2018-07-24 NOTE — Anesthesia Preprocedure Evaluation (Signed)
Anesthesia Evaluation  Patient identified by MRN, date of birth, ID band Patient awake    Reviewed: Allergy & Precautions, NPO status , Patient's Chart, lab work & pertinent test results  Airway Mallampati: III       Dental   Pulmonary sleep apnea ,    Pulmonary exam normal        Cardiovascular negative cardio ROS Normal cardiovascular exam     Neuro/Psych negative neurological ROS  negative psych ROS   GI/Hepatic Neg liver ROS, GERD  Medicated,  Endo/Other  diabetesMorbid obesity  Renal/GU   negative genitourinary   Musculoskeletal negative musculoskeletal ROS (+)   Abdominal (+) + obese,   Peds negative pediatric ROS (+)  Hematology   Anesthesia Other Findings   Reproductive/Obstetrics                             Anesthesia Physical Anesthesia Plan  ASA: II  Anesthesia Plan: General   Post-op Pain Management:    Induction: Intravenous  PONV Risk Score and Plan: Propofol infusion  Airway Management Planned: Nasal Cannula  Additional Equipment:   Intra-op Plan:   Post-operative Plan:   Informed Consent: I have reviewed the patients History and Physical, chart, labs and discussed the procedure including the risks, benefits and alternatives for the proposed anesthesia with the patient or authorized representative who has indicated his/her understanding and acceptance.   Dental advisory given  Plan Discussed with: CRNA and Surgeon  Anesthesia Plan Comments:         Anesthesia Quick Evaluation

## 2018-07-24 NOTE — H&P (Signed)
Adam Bellows, MD [address], [place], [place], [place], [address] [address], [place], [place], [place], [address] Phone: [phone]  Fax: [phone]  Primary Care Physician:  Birdie Sons, MD   Pre-Procedure History & Physical: HPI:  Adam Wolfe is a 52 y.o. male is here for an colonoscopy.   Past Medical History:  Diagnosis Date  . Diabetes mellitus without complication ([place])   . Gout   . History of chicken pox   . Sleep apnea     Past Surgical History:  Procedure Laterality Date  . Overnight Oximetry  04/13/2004   showed sleep apnea- Sleep study was done 04/30/2004 showed severe Obstructive sleep apnea  . sleep study  04/30/2004   Severe obstructive sleep apnea with nocturnal desaturations- started CPAP-13    Prior to Admission medications   Medication Sig Start Date End Date Taking? Authorizing Provider  allopurinol (ZYLOPRIM) 300 MG tablet TAKE 1 TABLET (300 MG TOTAL) BY MOUTH DAILY. 11/25/17   Birdie Sons, MD  glipiZIDE (GLUCOTROL XL) 2.5 MG 24 hr tablet TAKE 1 TABLET (2.5 MG TOTAL) BY MOUTH DAILY WITH BREAKFAST. 09/25/17   Birdie Sons, MD  glucose blood test strip 1 each by Other route as needed for other. Use as instructed 07/10/17   Birdie Sons, MD  metFORMIN (GLUCOPHAGE-XR) 500 MG 24 hr tablet Take 2 tablets (1,000 mg total) by mouth every evening. 05/25/18   Birdie Sons, MD  [facility] DELICA LANCETS FINE MISC Use to check blood sugar daily 07/10/17   Birdie Sons, MD  vitamin B-12 (CYANOCOBALAMIN) 1000 MCG tablet Take 1,000 mcg by mouth daily.    [provider]    Allergies as of 06/04/2018 - Review Complete 05/25/2018  Allergen Reaction Noted  . Lasix [furosemide] Hives 06/13/2016    Family History  Problem Relation Age of Onset  . COPD Other        smoker  . Hypertension Mother   . Cancer Father   . COPD Father     Social History   Socioeconomic History  . Marital status: Married    Spouse  name: Not on file  . Number of children: Not on file  . Years of education: Not on file  . Highest education level: Not on file  Occupational History  . Occupation: Customer service manager: [employer]  Social Needs  . Financial resource strain: Not on file  . Food insecurity:    Worry: Not on file    Inability: Not on file  . Transportation needs:    Medical: Not on file    Non-medical: Not on file  Tobacco Use  . Smoking status: Never Smoker  . Smokeless tobacco: Current User    Types: Chew  Substance and Sexual Activity  . Alcohol use: Not Currently    Alcohol/week: 0.0 standard drinks  . Drug use: No  . Sexual activity: Not on file  Lifestyle  . Physical activity:    Days per week: Not on file    Minutes per session: Not on file  . Stress: Not on file  Relationships  . Social connections:    Talks on phone: Not on file    Gets together: Not on file    Attends religious service: Not on file    Active member of club or organization: Not on file    Attends meetings of clubs or organizations: Not on file  Relationship status: Not on file  . Intimate partner violence:    Fear of current or ex partner: Not on file    Emotionally abused: Not on file    Physically abused: Not on file    Forced sexual activity: Not on file  Other Topics Concern  . Not on file  Social History Narrative   CPAP- 13cm pressure    Review of Systems: See HPI, otherwise negative ROS  Physical Exam: BP (!) 144/90   Pulse 86   Temp 98.4 F (36.9 C) (Tympanic)   Resp 17   Ht 5\' 8"  (1.727 m)   Wt (!) 142.9 kg   SpO2 96%   BMI 47.90 kg/m  General:   Alert,  pleasant and cooperative in NAD Head:  Normocephalic and atraumatic. Neck:  Supple; no masses or thyromegaly. Lungs:  Clear throughout to auscultation, normal respiratory effort.    Heart:  +S1, +S2, Regular rate and rhythm, No edema. Abdomen:  Soft, nontender and nondistended. Normal bowel sounds, without guarding,  and without rebound.   Neurologic:  Alert and  oriented x4;  grossly normal neurologically.  Impression/Plan: Adam Wolfe is here for an colonoscopy to be performed for Screening colonoscopy family history of colon cancer Risks, benefits, limitations, and alternatives regarding  colonoscopy have been reviewed with the patient.  Questions have been answered.  All parties agreeable.   Adam Bellows, MD  07/24/2018, 8:07 AM

## 2018-07-24 NOTE — Transfer of Care (Signed)
Immediate Anesthesia Transfer of Care Note  Patient: Adam Wolfe  Procedure(s) Performed: COLONOSCOPY WITH PROPOFOL (N/A )  Patient Location: PACU and Endoscopy Unit  Anesthesia Type:General  Level of Consciousness: awake  Airway & Oxygen Therapy: Patient Spontanous Breathing  Post-op Assessment: Report given to RN  Post vital signs: Reviewed and stable  Last Vitals:  Vitals Value Taken Time  BP    Temp    Pulse 71 07/24/2018  8:54 AM  Resp 20 07/24/2018  8:54 AM  SpO2 96 % 07/24/2018  8:54 AM  Vitals shown include unvalidated device data.  Last Pain:  Vitals:   07/24/18 0725  TempSrc: Tympanic  PainSc: 0-No pain         Complications: No apparent anesthesia complications

## 2018-07-24 NOTE — Anesthesia Postprocedure Evaluation (Signed)
Anesthesia Post Note  Patient: Adam Wolfe  Procedure(s) Performed: COLONOSCOPY WITH PROPOFOL (N/A )  Patient location during evaluation: Endoscopy Anesthesia Type: General Level of consciousness: awake and alert and oriented Pain management: pain level controlled Vital Signs Assessment: post-procedure vital signs reviewed and stable Respiratory status: spontaneous breathing Cardiovascular status: blood pressure returned to baseline Anesthetic complications: no     Last Vitals:  Vitals:   07/24/18 0725 07/24/18 0855  BP: (!) 144/90   Pulse: 86 71  Resp: 17 20  Temp: 36.9 C (!) 36 C  SpO2: 96% 96%    Last Pain:  Vitals:   07/24/18 0725  TempSrc: Tympanic  PainSc: 0-No pain                 Gabrella Stroh

## 2018-07-28 LAB — SURGICAL PATHOLOGY

## 2018-08-09 ENCOUNTER — Encounter: Payer: Self-pay | Admitting: Gastroenterology

## 2018-08-09 NOTE — Progress Notes (Signed)
Repeat colonoscopy in 1 year

## 2018-08-24 ENCOUNTER — Telehealth: Payer: Self-pay

## 2018-08-24 ENCOUNTER — Other Ambulatory Visit: Payer: Self-pay

## 2018-08-24 DIAGNOSIS — D122 Benign neoplasm of ascending colon: Secondary | ICD-10-CM

## 2018-08-24 DIAGNOSIS — Z800 Family history of malignant neoplasm of digestive organs: Secondary | ICD-10-CM

## 2018-08-24 DIAGNOSIS — D125 Benign neoplasm of sigmoid colon: Secondary | ICD-10-CM

## 2018-08-24 DIAGNOSIS — D120 Benign neoplasm of cecum: Secondary | ICD-10-CM

## 2018-08-24 NOTE — Telephone Encounter (Signed)
-----   Message from Jonathon Bellows, MD sent at 08/09/2018 12:36 PM EDT ----- Sherald Hess inform needs repeat colonoscopy in 1 year- have we referred him for genetic testing to evaluate for attenuated FAP as he had a lot of polyps. If not referred , please refer  C/c Birdie Sons, MD

## 2018-08-24 NOTE — Telephone Encounter (Signed)
Called pt to inform him that we have referred him to [facility] for genetic testing due to multiple polyps discovered during colonoscopy.  LVM to return call

## 2018-08-25 ENCOUNTER — Other Ambulatory Visit: Payer: Self-pay | Admitting: Family Medicine

## 2018-08-25 DIAGNOSIS — E119 Type 2 diabetes mellitus without complications: Secondary | ICD-10-CM

## 2018-08-26 ENCOUNTER — Other Ambulatory Visit: Payer: Self-pay | Admitting: Family Medicine

## 2018-08-26 ENCOUNTER — Encounter: Payer: Self-pay | Admitting: Family Medicine

## 2018-08-26 ENCOUNTER — Ambulatory Visit: Payer: BLUE CROSS/BLUE SHIELD | Admitting: Family Medicine

## 2018-08-26 VITALS — BP 143/89 | HR 87 | Temp 98.5°F | Resp 18 | Wt 328.0 lb

## 2018-08-26 DIAGNOSIS — Z6841 Body Mass Index (BMI) 40.0 and over, adult: Secondary | ICD-10-CM

## 2018-08-26 DIAGNOSIS — E119 Type 2 diabetes mellitus without complications: Secondary | ICD-10-CM

## 2018-08-26 DIAGNOSIS — Z23 Encounter for immunization: Secondary | ICD-10-CM | POA: Diagnosis not present

## 2018-08-26 LAB — POCT GLYCOSYLATED HEMOGLOBIN (HGB A1C)
ESTIMATED AVERAGE GLUCOSE: 174
HEMOGLOBIN A1C: 7.7 % — AB (ref 4.0–5.6)

## 2018-08-26 MED ORDER — GLIPIZIDE ER 5 MG PO TB24: 5.0000 mg | ORAL_TABLET | Freq: Every day | ORAL | 0 refills | Status: DC

## 2018-08-26 MED ORDER — GABAPENTIN 300 MG PO CAPS: 300.0000 mg | ORAL_CAPSULE | Freq: Every day | ORAL | 0 refills | Status: DC

## 2018-08-26 NOTE — Patient Instructions (Addendum)
   We may need to start a new medication such as Jardiance or Victoza to help with sugar and weight loss   You can take 2 x 2.5mg  glipizide tablets until your current bottle is gone, then change to 1 x 5mg  tablet daily

## 2018-08-26 NOTE — Progress Notes (Signed)
Patient: Adam Wolfe Male    DOB: October 09, 1966   52 y.o.   MRN: [id number] Visit Date: 08/26/2018  Today's Provider: Lelon Huh, MD   Chief Complaint  Patient presents with  . Diabetes  . Sleep Apnea   Subjective:    HPI  Diabetes Mellitus Type II, Follow-up:   Lab Results  Component Value Date   HGBA1C 8.0 (A) 05/25/2018   HGBA1C 6.8 11/05/2017   HGBA1C 8.8 08/05/2017    Last seen for diabetes 2 months ago.  Management since then includes doubling Metformin to twice daily. He reports good compliance with treatment. He is not having side effects.  Current symptoms include paresthesia of the feet and have been stable. Home blood sugar records: blood sugars are not checked at home  Episodes of hypoglycemia? no   Current Insulin Regimen: none Most Recent Eye Exam: 01/2018 Weight trend: fluctuating a bit Prior visit with dietician: no Current diet: regular diet Current exercise: none  Pertinent Labs:    Component Value Date/Time   CHOL 152 11/12/2017 0815   TRIG 326 (H) 11/12/2017 0815   HDL 28 (L) 11/12/2017 0815   LDLCALC 59 11/12/2017 0815   CREATININE 1.14 11/12/2017 0815   CREATININE 1.09 07/03/2017 1203    Wt Readings from Last 3 Encounters:  08/26/18 (!) 328 lb (148.8 kg)  07/24/18 (!) 315 lb (142.9 kg)  07/14/18 (!) 324 lb 3.2 oz (147.1 kg)    ------------------------------------------------------------------------ OSA: Patient was last seen for this problem 3 months ago. Management during that visit includes placing an order for a new CPAP machine. Patient reports that he has not received the new machine due to his required monthly out of pocket expense of $100.     Allergies  Allergen Reactions  . Lasix [Furosemide] Hives     Current Outpatient Medications:  .  allopurinol (ZYLOPRIM) 300 MG tablet, TAKE 1 TABLET (300 MG TOTAL) BY MOUTH DAILY., Disp: 90 tablet, Rfl: 4 .  glipiZIDE (GLUCOTROL XL) 2.5 MG 24 hr tablet, TAKE 1  TABLET (2.5 MG TOTAL) BY MOUTH DAILY WITH BREAKFAST., Disp: 30 tablet, Rfl: 11 .  glucose blood test strip, 1 each by Other route as needed for other. Use as instructed, Disp: 100 each, Rfl: 3 .  metFORMIN (GLUCOPHAGE-XR) 500 MG 24 hr tablet, TAKE 2 TABLETS (1,000 MG TOTAL) BY MOUTH EVERY EVENING., Disp: 180 tablet, Rfl: 3 .  ONETOUCH DELICA LANCETS FINE MISC, Use to check blood sugar daily, Disp: 100 each, Rfl: 3 .  vitamin B-12 (CYANOCOBALAMIN) 1000 MCG tablet, Take 1,000 mcg by mouth daily., Disp: , Rfl:   Review of Systems  Constitutional: Negative for appetite change, chills and fever.  Respiratory: Negative for chest tightness, shortness of breath and wheezing.   Cardiovascular: Negative for chest pain and palpitations.  Gastrointestinal: Negative for abdominal pain, nausea and vomiting.  Neurological: Positive for numbness (in feet).    Social History   Tobacco Use  . Smoking status: Never Smoker  . Smokeless tobacco: Current User    Types: Chew  Substance Use Topics  . Alcohol use: Not Currently    Alcohol/week: 0.0 standard drinks   Objective:   BP (!) 143/89 (BP Location: Left Arm, Patient Position: Sitting, Cuff Size: Large)   Pulse 87   Temp 98.5 F (36.9 C) (Oral)   Resp 18   Wt (!) 328 lb (148.8 kg)   SpO2 96% Comment: room air  BMI 49.87 kg/m  Vitals:  08/26/18 1550  BP: (!) 143/89  Pulse: 87  Resp: 18  Temp: 98.5 F (36.9 C)  TempSrc: Oral  SpO2: 96%  Weight: (!) 328 lb (148.8 kg)     Physical Exam  General Appearance:    Alert, cooperative, no distress, obese  Eyes:    PERRL, conjunctiva/corneas clear, EOM's intact       Lungs:     Clear to auscultation bilaterally, respirations unlabored  Heart:    Regular rate and rhythm  Neurologic:   Awake, alert, oriented x 3. No apparent focal neurological           defect.       Results for orders placed or performed in visit on 08/26/18  POCT HgB A1C  Result Value Ref Range   Hemoglobin A1C 7.7 (A)  4.0 - 5.6 %   HbA1c POC (<> result, manual entry)     HbA1c, POC (prediabetic range)     HbA1c, POC (controlled diabetic range)     Est. average glucose Bld gHb Est-mCnc 174        Assessment & Plan:     1. Diabetes mellitus without complication ([place]) Minimal improvement with increased dose of metformin. Discussed opions of adding SGLT inhibitor of GLP! Agonist. He is interested in trying one of these. He is going to check on insurance coverage and formularies. In the meantime will increase glipizide to 5mg  daily and he is to work on losing weight.  - POCT HgB A1C - glipiZIDE (GLUCOTROL XL) 5 MG 24 hr tablet; Take 1 tablet (5 mg total) by mouth daily with breakfast.  Dispense: 90 tablet; Refill: 0  2. BMI 45.0-49.9, adult ([place]) Counseled regarding prudent diet and regular exercise.    3. Need for influenza vaccination  - Flu Vaccine QUAD 6+ mos PF IM (Fluarix Quad PF)  4. Need for tetanus, diphtheria, and acellular pertussis (Tdap) vaccine  - Tdap vaccine greater than or equal to 7yo IM       Lelon Huh, MD  [facility]

## 2018-09-18 NOTE — Telephone Encounter (Signed)
Spoke with pt and informed him that Dr. Vicente Males has referred him to oncology/genetics for genetic testing due to multiple polyps discovered during his colonoscopy. Pt is aware the genetics dept will contact him to schedule. Pt referral was sent on 08-24-18. I've called oncology to check on the referral status since pt has not been contacted to schedule. I was able to speak with the oncology/genetics referral coordinator, Lenna Sciara, who assured me she would follow up with scheduling to ensure pt will be contacted.

## 2018-11-13 ENCOUNTER — Other Ambulatory Visit: Payer: Self-pay | Admitting: Family Medicine

## 2018-11-17 ENCOUNTER — Other Ambulatory Visit: Payer: Self-pay | Admitting: Family Medicine

## 2018-11-17 DIAGNOSIS — E119 Type 2 diabetes mellitus without complications: Secondary | ICD-10-CM

## 2018-11-26 ENCOUNTER — Other Ambulatory Visit: Payer: Self-pay | Admitting: Family Medicine

## 2018-12-02 ENCOUNTER — Ambulatory Visit (INDEPENDENT_AMBULATORY_CARE_PROVIDER_SITE_OTHER): Payer: BLUE CROSS/BLUE SHIELD | Admitting: Family Medicine

## 2018-12-02 ENCOUNTER — Encounter: Payer: Self-pay | Admitting: Family Medicine

## 2018-12-02 VITALS — BP 155/93 | HR 90 | Temp 98.7°F | Wt 331.0 lb

## 2018-12-02 DIAGNOSIS — E119 Type 2 diabetes mellitus without complications: Secondary | ICD-10-CM

## 2018-12-02 LAB — POCT GLYCOSYLATED HEMOGLOBIN (HGB A1C): Hemoglobin A1C: 8.1 % — AB (ref 4.0–5.6)

## 2018-12-02 LAB — POCT UA - MICROALBUMIN: Microalbumin Ur, POC: 20 mg/L

## 2018-12-02 MED ORDER — SEMAGLUTIDE(0.25 OR 0.5MG/DOS) 2 MG/1.5ML ~~LOC~~ SOPN: 0.2500 mg | PEN_INJECTOR | SUBCUTANEOUS | 0 refills | Status: AC

## 2018-12-02 MED ORDER — SEMAGLUTIDE (1 MG/DOSE) 2 MG/1.5ML ~~LOC~~ SOPN: 1.0000 mg | PEN_INJECTOR | SUBCUTANEOUS | 5 refills | Status: DC

## 2018-12-02 NOTE — Patient Instructions (Addendum)
.   Please review the attached list of medications and notify my office if there are any errors.   . Please bring all of your medications to every appointment so we can make sure that our medication list is the same as yours.    Start samples of Ozempic 0.25mg  once a week for 4 weeks, then increase to 0.5mg  once a week for two weeks, then start prescription for 1mg  once a week.    Finish you current bottle of glipizide, then stop   Continue your current dose of metformin   Call if you have any problems taking Ozempic or filling prescription for Ozempic   You may need to take daily dose of metamucil if you have any constipation after starting Ozempic   If you have a high co-pay, you can on the Ozempic web side to get a co-pay discount card

## 2018-12-02 NOTE — Progress Notes (Signed)
Patient: Adam Wolfe Male    DOB: Apr 22, 1966   53 y.o.   MRN: [id number] Visit Date: 12/02/2018  Today's Provider: Lelon Huh, MD   Chief Complaint  Patient presents with  . Diabetes   Subjective:     HPI   Diabetes Mellitus Type II, Follow-up:   Lab Results  Component Value Date   HGBA1C 7.7 (A) 08/26/2018   HGBA1C 8.0 (A) 05/25/2018   HGBA1C 6.8 11/05/2017   Last seen for diabetes 3 months ago.  Management since then includes increasing Glipizide to 5mg  a day and work on lifestyle changes.  Pt also advised to check with insurance coverage for SGLT inhibitor or GLP Anonst. He reports excellent compliance with treatment. He is not having side effects.  Current symptoms include none and have been stable. Home blood sugar records: Pt states he does not check his blood sugar at home.  Episodes of hypoglycemia? no   Current Insulin Regimen: None Most Recent Eye Exam: 01/29/2018 Weight trend: stable Prior visit with dietician: yes   Current diet: in general, a "healthy" diet   Current exercise: walking  ------------------------------------------------------------------------   Wt Readings from Last 3 Encounters:  12/02/18 (!) 331 lb (150.1 kg)  08/26/18 (!) 328 lb (148.8 kg)  07/24/18 (!) 315 lb (142.9 kg)   Results for orders placed or performed in visit on 08/26/18  POCT HgB A1C  Result Value Ref Range   Hemoglobin A1C 7.7 (A) 4.0 - 5.6 %   HbA1c POC (<> result, manual entry)     HbA1c, POC (prediabetic range)     HbA1c, POC (controlled diabetic range)     Est. average glucose Bld gHb Est-mCnc 174     ------------------------------------------------------------------------   Allergies  Allergen Reactions  . Lasix [Furosemide] Hives     Current Outpatient Medications:  .  allopurinol (ZYLOPRIM) 300 MG tablet, TAKE 1 TABLET (300 MG TOTAL) BY MOUTH DAILY., Disp: 90 tablet, Rfl: 4 .  gabapentin (NEURONTIN) 300 MG capsule, TAKE 1 CAPSULE BY  MOUTH EVERYDAY AT BEDTIME, Disp: 90 capsule, Rfl: 4 .  glipiZIDE (GLUCOTROL XL) 5 MG 24 hr tablet, TAKE 1 TABLET BY MOUTH EVERY DAY WITH BREAKFAST, Disp: 90 tablet, Rfl: 4 .  glucose blood test strip, 1 each by Other route as needed for other. Use as instructed, Disp: 100 each, Rfl: 3 .  metFORMIN (GLUCOPHAGE-XR) 500 MG 24 hr tablet, TAKE 2 TABLETS (1,000 MG TOTAL) BY MOUTH EVERY EVENING., Disp: 180 tablet, Rfl: 3 .  ONETOUCH DELICA LANCETS FINE MISC, Use to check blood sugar daily, Disp: 100 each, Rfl: 3 .  vitamin B-12 (CYANOCOBALAMIN) 1000 MCG tablet, Take 1,000 mcg by mouth daily., Disp: , Rfl:   Review of Systems  Constitutional: Negative.   Respiratory: Negative.   Cardiovascular: Positive for leg swelling. Negative for chest pain and palpitations.  Gastrointestinal: Negative.   Neurological: Negative for dizziness, light-headedness and headaches.    Social History   Tobacco Use  . Smoking status: Never Smoker  . Smokeless tobacco: Current User    Types: Chew  Substance Use Topics  . Alcohol use: Not Currently    Alcohol/week: 0.0 standard drinks      Objective:   BP (!) 155/93 (BP Location: Right Arm, Patient Position: Sitting, Cuff Size: Large)   Pulse 90   Temp 98.7 F (37.1 C) (Oral)   Wt (!) 331 lb (150.1 kg)   BMI 50.33 kg/m  Vitals:   12/02/18 1601  BP: Marland Kitchen)  155/93  Pulse: 90  Temp: 98.7 F (37.1 C)  TempSrc: Oral  Weight: (!) 331 lb (150.1 kg)     Physical Exam  General Appearance:    Alert, cooperative, no distress, obese  Eyes:    PERRL, conjunctiva/corneas clear, EOM's intact       Lungs:     Clear to auscultation bilaterally, respirations unlabored  Heart:    Regular rate and rhythm  Neurologic:   Awake, alert, oriented x 3. No apparent focal neurological           defect.       Results for orders placed or performed in visit on 12/02/18  POCT glycosylated hemoglobin (Hb A1C)  Result Value Ref Range   Hemoglobin A1C 8.1 (A) 4.0 - 5.6 %  POCT  UA - Microalbumin  Result Value Ref Range   Microalbumin Ur, POC 20 mg/L       Assessment & Plan    1. Diabetes mellitus without complication ([facility]) Uncontrolled. Add Ozempic as below.  Counseled on potential adverse effects and benefits of medication Instructed on proper administration and given first dose in office today.  - Semaglutide,0.25 or 0.5MG /DOS, (OZEMPIC, 0.25 OR 0.5 MG/DOSE,) 2 MG/1.5ML SOPN; Inject 0.25 mg into the skin once a week. For 4 weeks, then increase to 0.5mg  for 2 weeks.  Dispense: 1 pen; Refill: 0 - Semaglutide, 1 MG/DOSE, (OZEMPIC, 1 MG/DOSE,) 2 MG/1.5ML SOPN; Inject 1 mg into the skin once a week.  Dispense: 2 pen; Refill: 5  Future Appointments  Date Time Provider [place]  03/02/2019  4:00 PM Adarian Bur, Kirstie Peri, MD [facility] None       Lelon Huh, MD  [facility]

## 2019-01-12 ENCOUNTER — Other Ambulatory Visit: Payer: Self-pay | Admitting: Family Medicine

## 2019-01-12 MED ORDER — INSULIN PEN NEEDLE 32G X 6 MM MISC: 4 refills | Status: DC

## 2019-03-02 ENCOUNTER — Encounter: Payer: Self-pay | Admitting: Family Medicine

## 2019-03-02 ENCOUNTER — Ambulatory Visit: Payer: BLUE CROSS/BLUE SHIELD | Admitting: Family Medicine

## 2019-03-02 ENCOUNTER — Other Ambulatory Visit: Payer: Self-pay

## 2019-03-02 VITALS — BP 141/97 | HR 91 | Temp 98.6°F | Wt 321.0 lb

## 2019-03-02 DIAGNOSIS — E119 Type 2 diabetes mellitus without complications: Secondary | ICD-10-CM

## 2019-03-02 LAB — POCT GLYCOSYLATED HEMOGLOBIN (HGB A1C): Hemoglobin A1C: 7 % — AB (ref 4.0–5.6)

## 2019-03-02 NOTE — Progress Notes (Signed)
Patient: Adam Wolfe Male    DOB: 1965/12/18   53 y.o.   MRN: [id number] Visit Date: 03/02/2019  Today's Provider: Lelon Huh, MD   Chief Complaint  Patient presents with  . Diabetes   Subjective:     HPI     Diabetes Mellitus Type II, Follow-up:   Lab Results  Component Value Date   HGBA1C 8.1 (A) 12/02/2018   HGBA1C 7.7 (A) 08/26/2018   HGBA1C 8.0 (A) 05/25/2018   Last seen for diabetes 3 months ago.  Management since then includes stopped glipizide and added Ozempic. He reports excellent compliance with treatment. He is not having side effects.  Current symptoms include Numbness and have been stable. Home blood sugar records: fasting range: 140's  Episodes of hypoglycemia? no   Current Insulin Regimen: Ozempic 1mg  a week. Most Recent Eye Exam: Pt needs to schedule an eye exam. Weight trend: stable Current diet: in general, a "healthy" diet   Current exercise: Pt reports being active at work and home.   Wt Readings from Last 3 Encounters:  03/02/19 (!) 321 lb (145.6 kg)  12/02/18 (!) 331 lb (150.1 kg)  08/26/18 (!) 328 lb (148.8 kg)    ------------------------------------------------------------------------    Allergies  Allergen Reactions  . Lasix [Furosemide] Hives     Current Outpatient Medications:  .  allopurinol (ZYLOPRIM) 300 MG tablet, TAKE 1 TABLET (300 MG TOTAL) BY MOUTH DAILY., Disp: 90 tablet, Rfl: 4 .  gabapentin (NEURONTIN) 300 MG capsule, TAKE 1 CAPSULE BY MOUTH EVERYDAY AT BEDTIME, Disp: 90 capsule, Rfl: 4 .  glipiZIDE (GLUCOTROL XL) 5 MG 24 hr tablet, TAKE 1 TABLET BY MOUTH EVERY DAY WITH BREAKFAST, Disp: 90 tablet, Rfl: 4 .  glucose blood test strip, 1 each by Other route as needed for other. Use as instructed, Disp: 100 each, Rfl: 3 .  Insulin Pen Needle (NOVOFINE) 32G X 6 MM MISC, Use to check blood sugar daily for type 2 diabetes., Disp: 100 each, Rfl: 4 .  metFORMIN (GLUCOPHAGE-XR) 500 MG 24 hr tablet, TAKE 2 TABLETS  (1,000 MG TOTAL) BY MOUTH EVERY EVENING., Disp: 180 tablet, Rfl: 3 .  ONETOUCH DELICA LANCETS FINE MISC, Use to check blood sugar daily, Disp: 100 each, Rfl: 3 .  Semaglutide, 1 MG/DOSE, (OZEMPIC, 1 MG/DOSE,) 2 MG/1.5ML SOPN, Inject 1 mg into the skin once a week., Disp: 2 pen, Rfl: 5 .  vitamin B-12 (CYANOCOBALAMIN) 1000 MCG tablet, Take 1,000 mcg by mouth daily., Disp: , Rfl:   Review of Systems  Constitutional: Negative.   Respiratory: Negative.   Cardiovascular: Negative.   Gastrointestinal: Negative.   Endocrine: Negative.   Musculoskeletal: Negative.   Skin: Negative for wound.  Neurological: Negative for dizziness, light-headedness and headaches.    Social History   Tobacco Use  . Smoking status: Never Smoker  . Smokeless tobacco: Current User    Types: Chew  Substance Use Topics  . Alcohol use: Not Currently    Alcohol/week: 0.0 standard drinks      Objective:    Vitals:   03/02/19 1432  BP: (!) 141/97  Pulse: 91  Temp: 98.6 F (37 C)  TempSrc: Oral  Weight: (!) 321 lb (145.6 kg)     Physical Exam General Appearance:    Alert, cooperative, no distress, obese  Eyes:    PERRL, conjunctiva/corneas clear, EOM's intact       Lungs:     Clear to auscultation bilaterally, respirations unlabored  Heart:  Regular rate and rhythm  Neurologic:   Awake, alert, oriented x 3. No apparent focal neurological           defect.       Results for orders placed or performed in visit on 03/02/19  POCT glycosylated hemoglobin (Hb A1C)  Result Value Ref Range   Hemoglobin A1C 7.0 (A) 4.0 - 5.6 %        Assessment & Plan    1. Diabetes mellitus without complication ([place]) Well controlled.  Continue current medications.  Losing weight with Ozempic - POCT glycosylated hemoglobin (Hb A1C) Counseled regarding prudent diet and regular exercise.  Future Appointments  Date Time Provider [place]  07/06/2019  4:00 PM Fisher, Kirstie Peri, MD [facility] None        Lelon Huh, MD  [facility]

## 2019-03-02 NOTE — Patient Instructions (Signed)
.   Please review the attached list of medications and notify my office if there are any errors.   . Please bring all of your medications to every appointment so we can make sure that our medication list is the same as yours.   

## 2019-06-14 ENCOUNTER — Other Ambulatory Visit: Payer: Self-pay | Admitting: Family Medicine

## 2019-06-14 DIAGNOSIS — E119 Type 2 diabetes mellitus without complications: Secondary | ICD-10-CM

## 2019-07-06 ENCOUNTER — Ambulatory Visit: Payer: Self-pay | Admitting: Family Medicine

## 2019-08-13 ENCOUNTER — Other Ambulatory Visit: Payer: Self-pay | Admitting: Family Medicine

## 2019-08-13 DIAGNOSIS — E119 Type 2 diabetes mellitus without complications: Secondary | ICD-10-CM

## 2020-02-04 ENCOUNTER — Other Ambulatory Visit: Payer: Self-pay | Admitting: Family Medicine

## 2020-02-16 ENCOUNTER — Other Ambulatory Visit: Payer: Self-pay | Admitting: Family Medicine

## 2020-02-16 NOTE — Telephone Encounter (Signed)
Requested medications are due for refill today?  Yes  Requested medications are on active medication list?  Yes  Last Refill:   11/26/2018 # 90 with 4 refills  Future visit scheduled?  No  Notes to Clinic:  Medications failed RX refill protocol due to no uric acid level / creatinine check within the last 360 days.  Last Creatinine performed on 11/12/2017.  No previous uric acid level available.  Patient last seen 11 months ago.

## 2020-03-10 ENCOUNTER — Telehealth: Payer: Self-pay | Admitting: Family Medicine

## 2020-03-10 MED ORDER — GABAPENTIN 300 MG PO CAPS: ORAL_CAPSULE | ORAL | 1 refills | Status: DC

## 2020-03-10 NOTE — Addendum Note (Signed)
Addended by: Birdie Sons on: 03/10/2020 04:48 PM   Modules accepted: Orders

## 2020-03-10 NOTE — Telephone Encounter (Signed)
Patient scheduled for 03/21/2020 with PCP , patient states he was unable to schedule appointment in the past due to the pandemic and not having any insurance. patient requesting a few pills of gabapentin (NEURONTIN) 300 MG capsule to hold him over until appointment. Patient would like a follow response thru [facility] #[id number] Lorina Rabon, [place] Phone:  [phone]  Fax:  [phone]

## 2020-03-20 NOTE — Progress Notes (Signed)
I,Roshena L Chambers,acting as a scribe for Lelon Huh, MD.,have documented all relevant documentation on the behalf of Leontae Bostock, MD,as directed by  Lelon Huh, MD while in the presence of Lelon Huh, MD.   Established patient visit   Patient: Adam Wolfe   DOB: 15-May-1966   54 y.o. Male  MRN: [id number] Visit Date: 03/21/2020  Today's healthcare provider: Lelon Huh, MD   Chief Complaint  Patient presents with  . Diabetes  . Gout   Subjective    HPI Diabetes Mellitus Type II, Follow-up  Lab Results  Component Value Date   HGBA1C 6.2 (A) 03/21/2020   HGBA1C 7.0 (A) 03/02/2019   HGBA1C 8.1 (A) 12/02/2018   Wt Readings from Last 3 Encounters:  03/21/20 (!) 304 lb (137.9 kg)  03/02/19 (!) 321 lb (145.6 kg)  12/02/18 (!) 331 lb (150.1 kg)   Last seen for diabetes 1 year ago.  Management since then includes continuing same medication. He reports good compliance with treatment. He is not having side effects.  Symptoms: No fatigue No foot ulcerations  No appetite changes No nausea  Yes paresthesia of the feet  No polydipsia  No polyuria No visual disturbances   No vomiting     Home blood sugar records: blood sugars are not checked at home  Episodes of hypoglycemia? No    Current insulin regiment: none Most Recent Eye Exam: not UTD Current exercise: none Current diet habits: in general, an "unhealthy" diet  Pertinent Labs: Lab Results  Component Value Date   CHOL 152 11/12/2017   HDL 28 (L) 11/12/2017   LDLCALC 59 11/12/2017   TRIG 326 (H) 11/12/2017   CHOLHDL 5.4 (H) 11/12/2017   Lab Results  Component Value Date   NA 142 11/12/2017   K 4.5 11/12/2017   CREATININE 1.14 11/12/2017   GFRNONAA 75 11/12/2017   GFRAA 87 11/12/2017   GLUCOSE 137 (H) 11/12/2017     ---------------------------------------------------------------------------------------------------  Follow up for Gout:  The patient was last seen for this more than  1 year ago. Changes made at last visit include none.  He reports good compliance with treatment. He feels that condition is Improved. He is not having side effects.   -----------------------------------------------------------------------------------------    Medications: Outpatient Medications Prior to Visit  Medication Sig  . allopurinol (ZYLOPRIM) 300 MG tablet TAKE 1 TABLET (300 MG TOTAL) BY MOUTH DAILY.  Marland Kitchen gabapentin (NEURONTIN) 300 MG capsule TAKE 1 CAPSULE BY MOUTH EVERYDAY AT BEDTIME  . glucose blood test strip 1 each by Other route as needed for other. Use as instructed  . Insulin Pen Needle (NOVOFINE) 32G X 6 MM MISC Use to check blood sugar daily for type 2 diabetes.  . metFORMIN (GLUCOPHAGE-XR) 500 MG 24 hr tablet TAKE 2 TABLETS (1,000 MG TOTAL) BY MOUTH EVERY EVENING.  Glory Rosebush DELICA LANCETS FINE MISC Use to check blood sugar daily  . OZEMPIC, 1 MG/DOSE, 2 MG/1.5ML SOPN INJECT 1MG  INTO THE SKIN ONCE A WEEK AS DIRECTED  . vitamin B-12 (CYANOCOBALAMIN) 1000 MCG tablet Take 1,000 mcg by mouth daily.   No facility-administered medications prior to visit.    Review of Systems  Constitutional: Negative for appetite change, chills and fever.  Respiratory: Negative for chest tightness, shortness of breath and wheezing.   Cardiovascular: Negative for chest pain and palpitations.  Gastrointestinal: Negative for abdominal pain, nausea and vomiting.  Neurological: Positive for numbness (in feet).     Objective    BP (!) 141/93 (  BP Location: Right Arm, Cuff Size: Large)   Pulse 72   Temp (!) 97.5 F (36.4 C) (Temporal)   Resp 16   Wt (!) 304 lb (137.9 kg)   BMI 46.22 kg/m   Physical Exam   General: Appearance:    Severely obese male in no acute distress  Eyes:    PERRL, conjunctiva/corneas clear, EOM's intact       Lungs:     Clear to auscultation bilaterally, respirations unlabored  Heart:    Normal heart rate. Normal rhythm. No murmurs, rubs, or gallops.   MS:    All extremities are intact.   Neurologic:   Awake, alert, oriented x 3. No apparent focal neurological           defect.         Results for orders placed or performed in visit on 03/21/20  POCT HgB A1C  Result Value Ref Range   Hemoglobin A1C 6.2 (A) 4.0 - 5.6 %   Est. average glucose Bld gHb Est-mCnc 131     Assessment & Plan     1. Diabetes mellitus without complication ([place]) Very well controlled with metformin and Ozempic. Reminded he is due for diabetic eye exam and discussed benefits of ARBs in diabetics.  - Comprehensive metabolic panel - Lipid panel - Ambulatory referral to Gastroenterology  2. Hypertriglyceridemia -Lipid Panel  3. Essential hypertension Anticipated starting ARB after reviewing labs.  - Comprehensive metabolic panel  4. Morbid obesity ([place]) Some weight loss on Ozempic. Consider higher dose when available.   5. Prostate cancer screening  - PSA Total (Reflex To Free) ([facility] only)  6. History of adenomatous polyp of colon Was intended to have follow up colonoscopy in 2020 per Dr. Vicente Males.  - Ambulatory referral to Gastroenterology   No follow-ups on file.      The entirety of the information documented in the History of Present Illness, Review of Systems and Physical Exam were personally obtained by me. Portions of this information were initially documented by the CMA and reviewed by me for thoroughness and accuracy.      Lelon Huh, MD  [facility] [phone] (phone) [phone] (fax)  [place]

## 2020-03-21 ENCOUNTER — Ambulatory Visit: Payer: BC Managed Care – PPO | Admitting: Family Medicine

## 2020-03-21 ENCOUNTER — Other Ambulatory Visit: Payer: Self-pay

## 2020-03-21 ENCOUNTER — Encounter: Payer: Self-pay | Admitting: Family Medicine

## 2020-03-21 VITALS — BP 141/93 | HR 72 | Temp 97.5°F | Resp 16 | Wt 304.0 lb

## 2020-03-21 DIAGNOSIS — E119 Type 2 diabetes mellitus without complications: Secondary | ICD-10-CM

## 2020-03-21 DIAGNOSIS — I10 Essential (primary) hypertension: Secondary | ICD-10-CM | POA: Diagnosis not present

## 2020-03-21 DIAGNOSIS — Z86010 Personal history of colonic polyps: Secondary | ICD-10-CM

## 2020-03-21 DIAGNOSIS — Z125 Encounter for screening for malignant neoplasm of prostate: Secondary | ICD-10-CM

## 2020-03-21 DIAGNOSIS — Z860101 Personal history of adenomatous and serrated colon polyps: Secondary | ICD-10-CM | POA: Insufficient documentation

## 2020-03-21 DIAGNOSIS — E781 Pure hyperglyceridemia: Secondary | ICD-10-CM | POA: Diagnosis not present

## 2020-03-21 LAB — POCT GLYCOSYLATED HEMOGLOBIN (HGB A1C)
Est. average glucose Bld gHb Est-mCnc: 131
Hemoglobin A1C: 6.2 % — AB (ref 4.0–5.6)

## 2020-03-21 MED ORDER — ONETOUCH ULTRA VI STRP: ORAL_STRIP | 4 refills | Status: DC

## 2020-03-21 NOTE — Patient Instructions (Signed)
.   Please review the attached list of medications and notify my office if there are any errors.   . Please bring all of your medications to every appointment so we can make sure that our medication list is the same as yours.   . Covid-19 vaccines: The Covid vaccines have been given to hundreds of millions of people and found to be very effective and are as safe as any other vaccine.  The [employer] vaccine has been associated with very rare dangerous blood clots, but only in adult women under the age of 60.  The risk of dying from Covid infections is much higher than having a serious reaction to the vaccine.  I strongly recommend getting fully vaccinated against Covid-19.  I recommend that adult women under 60 get fully vaccinated, but the Moderna and Pfizer vaccines may be safer for those women than the Johnson [employer] Johnson vaccine.   

## 2020-03-22 ENCOUNTER — Other Ambulatory Visit: Payer: Self-pay | Admitting: Family Medicine

## 2020-03-22 DIAGNOSIS — I10 Essential (primary) hypertension: Secondary | ICD-10-CM

## 2020-03-22 DIAGNOSIS — E781 Pure hyperglyceridemia: Secondary | ICD-10-CM

## 2020-03-22 LAB — COMPREHENSIVE METABOLIC PANEL
ALT: 32 IU/L (ref 0–44)
AST: 27 IU/L (ref 0–40)
Albumin/Globulin Ratio: 1.8 (ref 1.2–2.2)
Albumin: 4.3 g/dL (ref 3.8–4.9)
Alkaline Phosphatase: 64 IU/L (ref 48–121)
BUN/Creatinine Ratio: 13 (ref 9–20)
BUN: 13 mg/dL (ref 6–24)
Bilirubin Total: 0.2 mg/dL (ref 0.0–1.2)
CO2: 22 mmol/L (ref 20–29)
Calcium: 9.6 mg/dL (ref 8.7–10.2)
Chloride: 103 mmol/L (ref 96–106)
Creatinine, Ser: 0.98 mg/dL (ref 0.76–1.27)
GFR calc Af Amer: 102 mL/min/{1.73_m2} (ref >59)
GFR calc non Af Amer: 88 mL/min/{1.73_m2} (ref >59)
Globulin, Total: 2.4 g/dL (ref 1.5–4.5)
Glucose: 106 mg/dL — ABNORMAL HIGH (ref 65–99)
Potassium: 4.2 mmol/L (ref 3.5–5.2)
Sodium: 140 mmol/L (ref 134–144)
Total Protein: 6.7 g/dL (ref 6.0–8.5)

## 2020-03-22 LAB — LIPID PANEL
Chol/HDL Ratio: 5.2 ratio — ABNORMAL HIGH (ref 0.0–5.0)
Cholesterol, Total: 157 mg/dL (ref 100–199)
HDL: 30 mg/dL — ABNORMAL LOW (ref >39)
LDL Chol Calc (NIH): 73 mg/dL (ref 0–99)
Triglycerides: 338 mg/dL — ABNORMAL HIGH (ref 0–149)
VLDL Cholesterol Cal: 54 mg/dL — ABNORMAL HIGH (ref 5–40)

## 2020-03-22 LAB — PSA TOTAL (REFLEX TO FREE): Prostate Specific Ag, Serum: 0.9 ng/mL (ref 0.0–4.0)

## 2020-03-22 MED ORDER — ROSUVASTATIN CALCIUM 10 MG PO TABS: 10.0000 mg | ORAL_TABLET | Freq: Every day | ORAL | 2 refills | Status: DC

## 2020-03-22 MED ORDER — VALSARTAN 80 MG PO TABS: 80.0000 mg | ORAL_TABLET | Freq: Every day | ORAL | 1 refills | Status: DC

## 2020-03-29 ENCOUNTER — Encounter: Payer: Self-pay | Admitting: *Deleted

## 2020-04-04 ENCOUNTER — Telehealth: Payer: Self-pay | Admitting: Family Medicine

## 2020-04-06 ENCOUNTER — Telehealth: Payer: Self-pay | Admitting: Family Medicine

## 2020-04-06 MED ORDER — GLUCOSE BLOOD VI STRP: ORAL_STRIP | 12 refills | Status: DC

## 2020-04-06 NOTE — Addendum Note (Signed)
Addended by: Jules Schick on: 04/06/2020 04:29 PM   Modules accepted: Orders

## 2020-04-06 NOTE — Telephone Encounter (Signed)
New RX sent to [facility]

## 2020-04-06 NOTE — Telephone Encounter (Signed)
Medication Refill - Medication: Verio Flex test strips   Pt is completely out of test strips, the wrong ones were submitted initially.   Has the patient contacted their pharmacy? Yes.   (Agent: If no, request that the patient contact the pharmacy for the refill.) (Agent: If yes, when and what did the pharmacy advise?)  Preferred Pharmacy (with phone number or street name):  [name]  [address] [place] [address]  Phone: [phone] Fax: [phone]     Agent: Please be advised that RX refills may take up to 3 business days. We ask that you follow-up with your pharmacy.

## 2020-04-06 NOTE — Telephone Encounter (Addendum)
Patient was started on valsartan and rosuvastatin in June for BP and high cholesterol. He Is supposed to follow up for these within 2 months of starting them. Please check and make sure he is doing ok with them and if so set up a follow up the first week or two of august. Thanks.

## 2020-04-06 NOTE — Telephone Encounter (Signed)
Per initial request below:  Requested medication (s) are due for refill today: Verio Flex Test Strips, yes  Requested medication (s) are on the active medication list:no  Last refill:  ?  Future visit scheduled: no  Notes to clinic: not on active med list     Medication Refill - Medication: Verio Flex test strips   Pt is completely out of test strips, the wrong ones were submitted initially.   Has the patient contacted their pharmacy? Yes.   (Agent: If no, request that the patient contact the pharmacy for the refill.) (Agent: If yes, when and what did the pharmacy advise?)  Preferred Pharmacy (with phone number or street name):  [id number] [name]  [address] [place] [address]  Phone: [phone] Fax: [phone]     Agent: Please be advised that RX refills may take up to 3 business days. We ask that you follow-up with your pharmacy.

## 2020-04-13 ENCOUNTER — Other Ambulatory Visit: Payer: Self-pay | Admitting: Family Medicine

## 2020-04-13 DIAGNOSIS — I10 Essential (primary) hypertension: Secondary | ICD-10-CM

## 2020-04-26 NOTE — Telephone Encounter (Signed)
Tried calling patient. Left message to call back. OK for PEC Triage to speak with patient and advise as below.

## 2020-04-26 NOTE — Telephone Encounter (Signed)
Spoke with patient- he is taking the prescribed medication and reports no side effects. ( Not checking BP at home) Appointment scheduled for August 6 for medication follow up

## 2020-05-07 ENCOUNTER — Other Ambulatory Visit: Payer: Self-pay | Admitting: Family Medicine

## 2020-05-07 DIAGNOSIS — I10 Essential (primary) hypertension: Secondary | ICD-10-CM

## 2020-05-12 ENCOUNTER — Other Ambulatory Visit: Payer: Self-pay | Admitting: Family Medicine

## 2020-05-12 MED ORDER — GLUCOSE BLOOD VI STRP: ORAL_STRIP | 12 refills | Status: DC

## 2020-05-12 MED ORDER — GABAPENTIN 300 MG PO CAPS: ORAL_CAPSULE | ORAL | 1 refills | Status: DC

## 2020-05-12 NOTE — Telephone Encounter (Signed)
Medication: gabapentin (NEURONTIN) 300 MG capsule [[id number]] , Verio Flex Test Strips glucose blood test strip [[id number]] , -last appt 03/21/20  Has the patient contacted their pharmacy? YES (Agent: If no, request that the patient contact the pharmacy for the refill.) (Agent: If yes, when and what did the pharmacy advise?)  Preferred Pharmacy (with phone number or street name): [id number] [name] [address]  Phone:  [phone] Fax:  [phone]     Agent: Please be advised that RX refills may take up to 3 business days. We ask that you follow-up with your pharmacy.

## 2020-05-17 NOTE — Progress Notes (Signed)
Established patient visit   Patient: Adam Wolfe   DOB: [birth year]   54 y.o. Male  MRN: [id number] Visit Date: 05/19/2020  Today's healthcare provider: Lelon Huh, MD   Chief Complaint  Patient presents with  . Hyperlipidemia  . Hypertension   I,Latasha Walston,acting as a scribe for Lelon Huh, MD.,have documented all relevant documentation on the behalf of Demontay Grantham, MD,as directed by  Lelon Huh, MD while in the presence of Lelon Huh, MD.  Subjective    HPI  Hypertension, follow-up  BP Readings from Last 3 Encounters:  05/19/20 134/89  03/21/20 (!) 141/93  03/02/19 (!) 141/97   Wt Readings from Last 3 Encounters:  05/19/20 (!) 305 lb 6.4 oz (138.5 kg)  03/21/20 (!) 304 lb (137.9 kg)  03/02/19 (!) 321 lb (145.6 kg)     He was last seen for hypertension 2 months ago.  BP at that visit was 141/93. Management since that visit includes started Valsartan 80mg  daily.  He reports good compliance with treatment. He is not having side effects.  He is following a Regular diet. He is not exercising. He does use smokeless tobacco.  Use of agents associated with hypertension: none.   Outside blood pressures are not being checked at home. Symptoms: No chest pain No chest pressure  No palpitations No syncope  No dyspnea No orthopnea  No paroxysmal nocturnal dyspnea No lower extremity edema   Pertinent labs: Lab Results  Component Value Date   CHOL 157 03/21/2020   HDL 30 (L) 03/21/2020   LDLCALC 73 03/21/2020   TRIG 338 (H) 03/21/2020   CHOLHDL 5.2 (H) 03/21/2020   Lab Results  Component Value Date   NA 140 03/21/2020   K 4.2 03/21/2020   CREATININE 0.98 03/21/2020   GFRNONAA 88 03/21/2020   GFRAA 102 03/21/2020   GLUCOSE 106 (H) 03/21/2020     The 10-year ASCVD risk score Mikey Bussing DC Jr., et al., 2013) is: 12%   ---------------------------------------------------------------------------------------------------  Lipid/Cholesterol,  Follow-up  Last lipid panel Other pertinent labs  Lab Results  Component Value Date   CHOL 157 03/21/2020   HDL 30 (L) 03/21/2020   LDLCALC 73 03/21/2020   TRIG 338 (H) 03/21/2020   CHOLHDL 5.2 (H) 03/21/2020   Lab Results  Component Value Date   ALT 32 03/21/2020   AST 27 03/21/2020   PLT 247 05/02/2016   TSH 2.64 09/02/2013     He was last seen for this 2 months ago.  Management since that visit includes started Rosuvastatin 10mg  daily.  He reports good compliance with treatment. He is not having side effects.   Symptoms: No chest pain No chest pressure/discomfort  No dyspnea No lower extremity edema  No numbness or tingling of extremity No orthopnea  No palpitations No paroxysmal nocturnal dyspnea  No speech difficulty No syncope   Current diet: in general, a "healthy" diet   Current exercise: none  The 10-year ASCVD risk score Mikey Bussing DC Jr., et al., 2013) is: 12%  ---------------------------------------------------------------------------------------------------    Medications: Outpatient Medications Prior to Visit  Medication Sig  . allopurinol (ZYLOPRIM) 300 MG tablet TAKE 1 TABLET (300 MG TOTAL) BY MOUTH DAILY.  Marland Kitchen gabapentin (NEURONTIN) 300 MG capsule TAKE 1 CAPSULE BY MOUTH EVERYDAY AT BEDTIME  . glucose blood test strip Use to check sugar daily for type 2 diabetes E11.9  . Insulin Pen Needle (NOVOFINE) 32G X 6 MM MISC Use to check blood sugar daily for type 2 diabetes.  Marland Kitchen  metFORMIN (GLUCOPHAGE-XR) 500 MG 24 hr tablet TAKE 2 TABLETS (1,000 MG TOTAL) BY MOUTH EVERY EVENING.  Glory Rosebush DELICA LANCETS FINE MISC Use to check blood sugar daily  . OZEMPIC, 1 MG/DOSE, 2 MG/1.5ML SOPN INJECT 1MG  INTO THE SKIN ONCE A WEEK AS DIRECTED  . rosuvastatin (CRESTOR) 10 MG tablet Take 1 tablet (10 mg total) by mouth daily.  . valsartan (DIOVAN) 80 MG tablet TAKE 1 TABLET BY MOUTH EVERY DAY  . vitamin B-12 (CYANOCOBALAMIN) 1000 MCG tablet Take 1,000 mcg by mouth daily.    No facility-administered medications prior to visit.    Review of Systems  Constitutional: Negative for appetite change, chills and fever.  Respiratory: Negative for chest tightness, shortness of breath and wheezing.   Cardiovascular: Negative for chest pain and palpitations.  Gastrointestinal: Negative for abdominal pain, nausea and vomiting.     Objective    BP 134/89 (BP Location: Right Arm, Patient Position: Sitting, Cuff Size: Large)   Pulse 76   Temp 97.7 F (36.5 C) (Oral)   Ht 5\' 8"  (1.727 m)   Wt (!) 305 lb 6.4 oz (138.5 kg)   BMI 46.44 kg/m   Physical Exam   General: Appearance:    Severely obese male in no acute distress  Eyes:    PERRL, conjunctiva/corneas clear, EOM's intact       Lungs:     Clear to auscultation bilaterally, respirations unlabored  Heart:    Normal heart rate. Normal rhythm. No murmurs, rubs, or gallops.   MS:   All extremities are intact.   Neurologic:   Awake, alert, oriented x 3. No apparent focal neurological           defect.          Assessment & Plan     1. Essential hypertension Doing well with initiation of valsartan. Goal is BP<130/85, expect BP to continue improve if he loses more weight with Ozempic. Will reassess with diabetes in about 3 months.  - valsartan (DIOVAN) 80 MG tablet; Take 1 tablet (80 mg total) by mouth daily.  Dispense: 90 tablet; Refill: 3  No follow-ups on file.      The entirety of the information documented in the History of Present Illness, Review of Systems and Physical Exam were personally obtained by me. Portions of this information were initially documented by the CMA and reviewed by me for thoroughness and accuracy.      Lelon Huh, MD  [facility] [phone] (phone) [phone] (fax)  [place]

## 2020-05-19 ENCOUNTER — Ambulatory Visit: Payer: BC Managed Care – PPO | Admitting: Family Medicine

## 2020-05-19 ENCOUNTER — Other Ambulatory Visit: Payer: Self-pay

## 2020-05-19 ENCOUNTER — Encounter: Payer: Self-pay | Admitting: Family Medicine

## 2020-05-19 VITALS — BP 134/89 | HR 76 | Temp 97.7°F | Ht 68.0 in | Wt 305.4 lb

## 2020-05-19 DIAGNOSIS — I10 Essential (primary) hypertension: Secondary | ICD-10-CM | POA: Diagnosis not present

## 2020-05-19 DIAGNOSIS — E781 Pure hyperglyceridemia: Secondary | ICD-10-CM

## 2020-05-19 MED ORDER — VALSARTAN 80 MG PO TABS: 80.0000 mg | ORAL_TABLET | Freq: Every day | ORAL | 3 refills | Status: DC

## 2020-06-13 ENCOUNTER — Other Ambulatory Visit: Payer: Self-pay | Admitting: Family Medicine

## 2020-06-13 DIAGNOSIS — E781 Pure hyperglyceridemia: Secondary | ICD-10-CM

## 2020-06-13 NOTE — Telephone Encounter (Signed)
Requested Prescriptions  Pending Prescriptions Disp Refills  . rosuvastatin (CRESTOR) 10 MG tablet [Pharmacy Med Name: ROSUVASTATIN CALCIUM 10 MG TAB] 90 tablet     Sig: TAKE 1 TABLET BY MOUTH EVERY DAY     Cardiovascular:  Antilipid - Statins Failed - 06/13/2020  1:56 AM      Failed - LDL in normal range and within 360 days    LDL Chol Calc (NIH)  Date Value Ref Range Status  03/21/2020 73 0 - 99 mg/dL Final         Failed - HDL in normal range and within 360 days    HDL  Date Value Ref Range Status  03/21/2020 30 (L) >39 mg/dL Final         Failed - Triglycerides in normal range and within 360 days    Triglycerides  Date Value Ref Range Status  03/21/2020 338 (H) 0 - 149 mg/dL Final         Passed - Total Cholesterol in normal range and within 360 days    Cholesterol, Total  Date Value Ref Range Status  03/21/2020 157 100 - 199 mg/dL Final         Passed - Patient is not pregnant      Passed - Valid encounter within last 12 months    Recent Outpatient Visits          3 weeks ago Essential hypertension   [facility] Birdie Sons, MD   2 months ago Diabetes mellitus without complication [facility])   [facility] Birdie Sons, MD   1 year ago Diabetes mellitus without complication [facility])   [facility] Birdie Sons, MD   1 year ago Diabetes mellitus without complication [facility])   [facility] Birdie Sons, MD   1 year ago Diabetes mellitus without complication [facility])   [facility] Birdie Sons, MD      Future Appointments            In 2 months Fisher, Kirstie Peri, MD [facility] [place]

## 2020-06-24 ENCOUNTER — Other Ambulatory Visit: Payer: Self-pay | Admitting: Family Medicine

## 2020-06-24 DIAGNOSIS — E119 Type 2 diabetes mellitus without complications: Secondary | ICD-10-CM

## 2020-07-07 ENCOUNTER — Other Ambulatory Visit: Payer: Self-pay | Admitting: Family Medicine

## 2020-07-07 DIAGNOSIS — E119 Type 2 diabetes mellitus without complications: Secondary | ICD-10-CM

## 2020-07-10 ENCOUNTER — Other Ambulatory Visit: Payer: Self-pay | Admitting: Family Medicine

## 2020-07-10 ENCOUNTER — Encounter: Payer: Self-pay | Admitting: Family Medicine

## 2020-08-23 ENCOUNTER — Other Ambulatory Visit: Payer: Self-pay

## 2020-08-23 ENCOUNTER — Encounter: Payer: Self-pay | Admitting: Family Medicine

## 2020-08-23 ENCOUNTER — Ambulatory Visit: Payer: BC Managed Care – PPO | Admitting: Family Medicine

## 2020-08-23 VITALS — BP 129/85 | HR 73 | Temp 97.9°F | Resp 20 | Wt 311.0 lb

## 2020-08-23 DIAGNOSIS — E119 Type 2 diabetes mellitus without complications: Secondary | ICD-10-CM | POA: Diagnosis not present

## 2020-08-23 DIAGNOSIS — I10 Essential (primary) hypertension: Secondary | ICD-10-CM

## 2020-08-23 LAB — POCT GLYCOSYLATED HEMOGLOBIN (HGB A1C)
Est. average glucose Bld gHb Est-mCnc: 151
Hemoglobin A1C: 6.9 % — AB (ref 4.0–5.6)

## 2020-08-23 MED ORDER — METFORMIN HCL ER 500 MG PO TB24: 1000.0000 mg | ORAL_TABLET | Freq: Two times a day (BID) | ORAL | 1 refills | Status: DC

## 2020-08-23 NOTE — Patient Instructions (Addendum)
.   Please review the attached list of medications and notify my office if there are any errors.   . Please bring all of your medications to every appointment so we can make sure that our medication list is the same as yours.   . Please contact your eyecare professional to schedule a routine eye exam   Your are due for colonoscopy to follow up on pre-cancerous polyps removed in 2019. Please send me message as soon as you are able to have this done, and we will put in a referral.   . It is recommended to engage in 150 minutes of moderate exercise every week.     . Covid-19 vaccines: The Covid vaccines have been given to hundreds of millions of people and found to be very effective and are as safe as any other vaccine.  The [employer] vaccine has been associated with very rare dangerous blood clots, but only in adult women under the age of 5.  Your risk of dying from Covid infections is much higher than having a serious reaction to the vaccine.  I strongly recommend getting fully vaccinated against Covid-19.  I recommend that adult women under 60 get fully vaccinated, but the [employer] vaccines may be safer for those women than the [employer] vaccine.

## 2020-08-23 NOTE — Progress Notes (Signed)
Established patient visit   Patient: Adam Wolfe   DOB: 1966-08-07   54 y.o. Male  MRN: [id number] Visit Date: 08/23/2020  Today's healthcare provider: Lelon Huh, MD   Chief Complaint  Patient presents with  . Diabetes  . Hyperlipidemia  . Hypertension   Subjective    HPI  Diabetes Mellitus Type II, Follow-up  Lab Results  Component Value Date   HGBA1C 6.9 (A) 08/23/2020   HGBA1C 6.2 (A) 03/21/2020   HGBA1C 7.0 (A) 03/02/2019   Wt Readings from Last 3 Encounters:  08/23/20 (!) 311 lb (141.1 kg)  05/19/20 (!) 305 lb 6.4 oz (138.5 kg)  03/21/20 (!) 304 lb (137.9 kg)   Last seen for diabetes 5 months ago.  Management since then includes continue same medication. He reports good compliance with treatment. He is not having side effects.  Symptoms: No fatigue No foot ulcerations  No appetite changes No nausea  No paresthesia of the feet  No polydipsia  No polyuria No visual disturbances   No vomiting     Home blood sugar records: blood sugars are not checked  Episodes of hypoglycemia? No    Current insulin regiment: none; takes Ozempic and Metformin Most Recent Eye Exam: not UTD Current exercise: none Current diet habits: in general, an "unhealthy" diet  Pertinent Labs: Lab Results  Component Value Date   CHOL 157 03/21/2020   HDL 30 (L) 03/21/2020   LDLCALC 73 03/21/2020   TRIG 338 (H) 03/21/2020   CHOLHDL 5.2 (H) 03/21/2020   Lab Results  Component Value Date   NA 140 03/21/2020   K 4.2 03/21/2020   CREATININE 0.98 03/21/2020   GFRNONAA 88 03/21/2020   GFRAA 102 03/21/2020   GLUCOSE 106 (H) 03/21/2020     ---------------------------------------------------------------------------------------------------  Hypertension, follow-up  BP Readings from Last 3 Encounters:  08/23/20 129/85  05/19/20 134/89  03/21/20 (!) 141/93   Wt Readings from Last 3 Encounters:  08/23/20 (!) 311 lb (141.1 kg)  05/19/20 (!) 305 lb 6.4 oz (138.5 kg)    03/21/20 (!) 304 lb (137.9 kg)     He was last seen for hypertension 3 months ago.  BP at that visit was 134/89. Management since that visit includes continue same medication.  He reports good compliance with treatment. He is not having side effects.  He is following a Regular diet. He is not exercising. He does not smoke.  Use of agents associated with hypertension: none.   Outside blood pressures are not checked. Symptoms: No chest pain No chest pressure  No palpitations No syncope  No dyspnea No orthopnea  No paroxysmal nocturnal dyspnea No lower extremity edema   Pertinent labs: Lab Results  Component Value Date   CHOL 157 03/21/2020   HDL 30 (L) 03/21/2020   LDLCALC 73 03/21/2020   TRIG 338 (H) 03/21/2020   CHOLHDL 5.2 (H) 03/21/2020   Lab Results  Component Value Date   NA 140 03/21/2020   K 4.2 03/21/2020   CREATININE 0.98 03/21/2020   GFRNONAA 88 03/21/2020   GFRAA 102 03/21/2020   GLUCOSE 106 (H) 03/21/2020     The 10-year ASCVD risk score Mikey Bussing DC Jr., et al., 2013) is: 11.3%   ---------------------------------------------------------------------------------------------------  Lipid/Cholesterol, Follow-up  Last lipid panel Other pertinent labs  Lab Results  Component Value Date   CHOL 157 03/21/2020   HDL 30 (L) 03/21/2020   LDLCALC 73 03/21/2020   TRIG 338 (H) 03/21/2020   CHOLHDL 5.2 (H) 03/21/2020  Lab Results  Component Value Date   ALT 32 03/21/2020   AST 27 03/21/2020   PLT 247 05/02/2016   TSH 2.64 09/02/2013     He was last seen for this 3 months ago.  Management since that visit includes continue same medication.  He reports good compliance with treatment. He is not having side effects.   Symptoms: No chest pain No chest pressure/discomfort  No dyspnea No lower extremity edema  No numbness or tingling of extremity No orthopnea  No palpitations No paroxysmal nocturnal dyspnea  No speech difficulty No syncope   Current  diet: in general, an "unhealthy" diet Current exercise: none  The 10-year ASCVD risk score Mikey Bussing DC Jr., et al., 2013) is: 11.3%  ---------------------------------------------------------------------------------------------------    Medications: Outpatient Medications Prior to Visit  Medication Sig  . allopurinol (ZYLOPRIM) 300 MG tablet TAKE 1 TABLET (300 MG TOTAL) BY MOUTH DAILY.  Marland Kitchen gabapentin (NEURONTIN) 300 MG capsule TAKE 1 CAPSULE BY MOUTH EVERYDAY AT BEDTIME  . glucose blood test strip Use to check sugar daily for type 2 diabetes E11.9  . Insulin Pen Needle (NOVOFINE) 32G X 6 MM MISC Use to check blood sugar daily for type 2 diabetes.  . metFORMIN (GLUCOPHAGE-XR) 500 MG 24 hr tablet TAKE 2 TABLETS (1,000 MG TOTAL) BY MOUTH EVERY EVENING.  Glory Rosebush DELICA LANCETS FINE MISC Use to check blood sugar daily  . OZEMPIC, 1 MG/DOSE, 2 MG/1.5ML SOPN INJECT 1MG  INTO THE SKIN ONCE A WEEK AS DIRECTED  . rosuvastatin (CRESTOR) 10 MG tablet TAKE 1 TABLET BY MOUTH EVERY DAY  . valsartan (DIOVAN) 80 MG tablet Take 1 tablet (80 mg total) by mouth daily.  . vitamin B-12 (CYANOCOBALAMIN) 1000 MCG tablet Take 1,000 mcg by mouth daily.   No facility-administered medications prior to visit.    Review of Systems  Constitutional: Negative for appetite change, chills and fever.  Respiratory: Negative for chest tightness, shortness of breath and wheezing.   Cardiovascular: Negative for chest pain and palpitations.  Gastrointestinal: Negative for abdominal pain, nausea and vomiting.      Objective    BP 129/85   Pulse 73   Temp 97.9 F (36.6 C) (Oral)   Resp 20   Wt (!) 311 lb (141.1 kg)   BMI 47.29 kg/m    Physical Exam   General: Appearance:    Severely obese male in no acute distress  Eyes:    PERRL, conjunctiva/corneas clear, EOM's intact       Lungs:     Clear to auscultation bilaterally, respirations unlabored  Heart:    Normal heart rate. Normal rhythm. No murmurs, rubs, or  gallops.   MS:   All extremities are intact.   Neurologic:   Awake, alert, oriented x 3. No apparent focal neurological           defect.         Results for orders placed or performed in visit on 08/23/20  POCT HgB A1C  Result Value Ref Range   Hemoglobin A1C 6.9 (A) 4.0 - 5.6 %   Est. average glucose Bld gHb Est-mCnc 151     Assessment & Plan     1. Diabetes mellitus without complication (HCC) V6H is slipping back up, will double dose to - metFORMIN (GLUCOPHAGE-XR) 500 MG 24 hr tablet; Take 2 tablets (1,000 mg total) by mouth 2 (two) times daily before a meal.  Dispense: 360 tablet; Refill: 1  2. Essential hypertension Well controlled.  Continue current  medications.    He was strongly encourage to get vaccinate against Covid. He declined vaccine today.  He declined flu vaccine today.   Follow up 4 months.      The entirety of the information documented in the History of Present Illness, Review of Systems and Physical Exam were personally obtained by me. Portions of this information were initially documented by the CMA and reviewed by me for thoroughness and accuracy.      Lelon Huh, MD  [facility] [phone] (phone) [phone] (fax)  [place]

## 2020-10-23 ENCOUNTER — Other Ambulatory Visit: Payer: Self-pay

## 2020-10-23 ENCOUNTER — Telehealth (INDEPENDENT_AMBULATORY_CARE_PROVIDER_SITE_OTHER): Payer: Self-pay | Admitting: Physician Assistant

## 2020-10-23 DIAGNOSIS — Z20822 Contact with and (suspected) exposure to covid-19: Secondary | ICD-10-CM

## 2020-10-23 MED ORDER — FLUTICASONE PROPIONATE 50 MCG/ACT NA SUSP: 2.0000 | Freq: Every day | NASAL | 6 refills | Status: DC

## 2020-10-23 MED ORDER — BENZONATATE 200 MG PO CAPS: 200.0000 mg | ORAL_CAPSULE | Freq: Three times a day (TID) | ORAL | 0 refills | Status: DC | PRN

## 2020-10-23 NOTE — Progress Notes (Signed)
MyChart Video Visit    Virtual Visit via Video Note   This visit type was conducted due to national recommendations for restrictions regarding the COVID-19 Pandemic (e.g. social distancing) in an effort to limit this patient's exposure and mitigate transmission in our community. This patient is at least at moderate risk for complications without adequate follow up. This format is felt to be most appropriate for this patient at this time. Physical exam was limited by quality of the video and audio technology used for the visit.   Patient location: Home Provider location: [facility]  I discussed the limitations of evaluation and management by telemedicine and the availability of in person appointments. The patient expressed understanding and agreed to proceed.  Patient: Adam Wolfe   DOB: 14-Feb-1966   55 y.o. Male  MRN: [id number] Visit Date: 10/23/2020  Today's healthcare provider: Margaretann Loveless, PA-C   No chief complaint on file.  Subjective    HPI  Upper Respiratory Infection: Patient complains of symptoms of a URI. Symptoms include congestion, cough and fatigue. Onset of symptoms was 2 days ago, unchanged since that time. He also c/o achiness, congestion and cough described as nonproductive for the past 1 day .  He is drinking plenty of fluids. Evaluation to date: none. Treatment to date: Nyquil.  Had similar symptoms about 2 weeks ago, symptoms resolved. Not sure if went completely away. Wife is having similar symptoms. Wife tested for covid 19 earlier in symptom onset and was negative. Both would like to be tested again since symptoms have not resolved.    Patient Active Problem List   Diagnosis Date Noted  . Essential hypertension 03/21/2020  . History of adenomatous polyp of colon 03/21/2020  . Morbid obesity (HCC) 03/21/2020  . Diabetes mellitus without complication (HCC) 07/03/2017  . Chronic venous insufficiency 06/13/2016  . GERD  (gastroesophageal reflux disease) 10/27/2015  . Personal history of gout 10/27/2015  . Obstructive sleep apnea 10/27/2015  . Hypertriglyceridemia 07/06/2009   Past Medical History:  Diagnosis Date  . Diabetes mellitus without complication (HCC)   . Gout   . History of chicken pox   . Sleep apnea    Family History  Problem Relation Age of Onset  . COPD Other        smoker  . Hypertension Mother   . Cancer Father   . COPD Father    Allergies  Allergen Reactions  . Lasix [Furosemide] Hives      Medications: Outpatient Medications Prior to Visit  Medication Sig  . allopurinol (ZYLOPRIM) 300 MG tablet TAKE 1 TABLET (300 MG TOTAL) BY MOUTH DAILY.  Marland Kitchen gabapentin (NEURONTIN) 300 MG capsule TAKE 1 CAPSULE BY MOUTH EVERYDAY AT BEDTIME  . glucose blood test strip Use to check sugar daily for type 2 diabetes E11.9  . Insulin Pen Needle (NOVOFINE) 32G X 6 MM MISC Use to check blood sugar daily for type 2 diabetes.  . metFORMIN (GLUCOPHAGE-XR) 500 MG 24 hr tablet Take 2 tablets (1,000 mg total) by mouth 2 (two) times daily before a meal.  . ONETOUCH DELICA LANCETS FINE MISC Use to check blood sugar daily  . OZEMPIC, 1 MG/DOSE, 2 MG/1.5ML SOPN INJECT 1MG  INTO THE SKIN ONCE A WEEK AS DIRECTED  . rosuvastatin (CRESTOR) 10 MG tablet TAKE 1 TABLET BY MOUTH EVERY DAY  . valsartan (DIOVAN) 80 MG tablet Take 1 tablet (80 mg total) by mouth daily.  . vitamin B-12 (CYANOCOBALAMIN) 1000 MCG tablet Take 1,000 mcg  by mouth daily.   No facility-administered medications prior to visit.    Review of Systems  Constitutional: Positive for fatigue. Negative for chills and fever.  HENT: Positive for congestion, ear pain, postnasal drip, rhinorrhea, sinus pressure, sinus pain, sneezing and tinnitus. Negative for ear discharge, hearing loss and sore throat.   Eyes: Positive for redness and itching. Negative for photophobia, discharge and visual disturbance.  Respiratory: Positive for cough. Negative for  chest tightness, shortness of breath, wheezing and stridor.   Cardiovascular: Negative.   Gastrointestinal: Negative for abdominal pain, diarrhea, nausea and vomiting.  Neurological: Positive for dizziness (with standing) and headaches.    Last CBC Lab Results  Component Value Date   WBC 8.0 05/02/2016   HGB 15.5 05/02/2016   HCT 43.6 05/02/2016   MCV 89.8 05/02/2016   MCH 31.9 05/02/2016   RDW 13.5 05/02/2016   PLT 247 05/02/2016   Last metabolic panel Lab Results  Component Value Date   GLUCOSE 106 (H) 03/21/2020   NA 140 03/21/2020   K 4.2 03/21/2020   CL 103 03/21/2020   CO2 22 03/21/2020   BUN 13 03/21/2020   CREATININE 0.98 03/21/2020   GFRNONAA 88 03/21/2020   GFRAA 102 03/21/2020   CALCIUM 9.6 03/21/2020   PROT 6.7 03/21/2020   ALBUMIN 4.3 03/21/2020   LABGLOB 2.4 03/21/2020   AGRATIO 1.8 03/21/2020   BILITOT 0.2 03/21/2020   ALKPHOS 64 03/21/2020   AST 27 03/21/2020   ALT 32 03/21/2020   ANIONGAP 6 05/02/2016      Objective    There were no vitals taken for this visit. BP Readings from Last 3 Encounters:  08/23/20 129/85  05/19/20 134/89  03/21/20 (!) 141/93   Wt Readings from Last 3 Encounters:  08/23/20 (!) 311 lb (141.1 kg)  05/19/20 (!) 305 lb 6.4 oz (138.5 kg)  03/21/20 (!) 304 lb (137.9 kg)      Physical Exam Pulmonary:     Effort: No respiratory distress (able to speak in full sentences).        Assessment & Plan     1. Suspected COVID-19 virus infection Worsening symptoms. Suspect Viral URI. Will get covid 19 testing at community testing offered tonight from 5-7p. Will give flonase as below for nasal congestion. Tessalon perles for cough. Continue symptomatic management of choice OTC. Push fluids. Rest as needed. I will f/u pending testing results. Call if worsening symptoms in the meantime.  - fluticasone (FLONASE) 50 MCG/ACT nasal spray; Place 2 sprays into both nostrils daily.  Dispense: 16 g; Refill: 6 - benzonatate (TESSALON)  200 MG capsule; Take 1 capsule (200 mg total) by mouth 3 (three) times daily as needed.  Dispense: 30 capsule; Refill: 0   No follow-ups on file.     I discussed the assessment and treatment plan with the patient. The patient was provided an opportunity to ask questions and all were answered. The patient agreed with the plan and demonstrated an understanding of the instructions.   The patient was advised to call back or seek an in-person evaluation if the symptoms worsen or if the condition fails to improve as anticipated.  I provided 14 minutes of non-face-to-face time during this encounter.  Delmer Islam, PA-C, have reviewed all documentation for this visit. The documentation on 10/24/20 for the exam, diagnosis, procedures, and orders are all accurate and complete.  Reine Just [facility] [phone] (phone) [phone] (fax)  [facility]

## 2020-10-24 ENCOUNTER — Encounter: Payer: Self-pay | Admitting: Physician Assistant

## 2020-10-24 NOTE — Patient Instructions (Signed)

## 2020-10-26 LAB — NOVEL CORONAVIRUS, NAA: SARS-CoV-2, NAA: DETECTED — AB

## 2020-10-27 ENCOUNTER — Telehealth (HOSPITAL_COMMUNITY): Payer: Self-pay | Admitting: Internal Medicine

## 2020-10-27 ENCOUNTER — Encounter (HOSPITAL_COMMUNITY): Payer: Self-pay

## 2020-10-27 NOTE — Telephone Encounter (Signed)
Called to discuss with patient about COVID-19 symptoms and the use of one of the available treatments for those with mild to moderate Covid symptoms and at a high risk of hospitalization.  Pt appears to qualify for outpatient treatment due to co-morbid conditions and/or a member of an at-risk group in accordance with the FDA Emergency Use Authorization.    Symptom onset: unknown Vaccinated: unknown Booster? unknown Immunocompromised? no Qualifiers: DM2, BMI, smoker  Unable to reach pt. Left VM and [facility] with with f/u instructions if interested in being screened for outpt covid tx.  Alan Ripper, [place]

## 2020-10-27 NOTE — Telephone Encounter (Signed)
Called to discuss with patient about COVID-19 symptoms and the use of one of the available treatments for those with mild to moderate Covid symptoms and at a high risk of hospitalization.  Pt appears to qualify for outpatient treatment due to co-morbid conditions and/or a member of an at-risk group in accordance with the FDA Emergency Use Authorization.    Symptom onset: 1/7 Vaccinated: no Booster? no Immunocompromised? no Qualifiers: BMI, htn, smoker  Unfortunately, pt is out of the treatment window for MAB infusion and oral antivirals. He is feeling a bit better. Discussed f/u precautions. Pt verified understanding.   Alan Ripper, [place]

## 2020-12-26 NOTE — Progress Notes (Signed)
Established patient visit   Patient: Adam Wolfe   DOB: 30-Dec-1965   55 y.o. Male  MRN: [id number] Visit Date: 12/27/2020  Today's healthcare provider: Lelon Huh, MD   Chief Complaint  Patient presents with  . Diabetes  . Hypertension   Subjective    HPI  Diabetes Mellitus Type II, Follow-up  Lab Results  Component Value Date   HGBA1C 6.2 (A) 12/27/2020   HGBA1C 6.9 (A) 08/23/2020   HGBA1C 6.2 (A) 03/21/2020   Wt Readings from Last 3 Encounters:  12/27/20 (!) 309 lb 12.8 oz (140.5 kg)  08/23/20 (!) 311 lb (141.1 kg)  05/19/20 (!) 305 lb 6.4 oz (138.5 kg)   Last seen for diabetes 4 months ago.  Management since then includes doubling the dose of Metformin 500mg  to 2 tablets daily. He reports good compliance with treatment. He is not having side effects.  Symptoms: No fatigue No foot ulcerations  No appetite changes No nausea  Yes paresthesia of the feet  No polydipsia  No polyuria No visual disturbances   No vomiting     Home blood sugar records: blood sugars are not checked  Episodes of hypoglycemia? No    Current insulin regiment: none Most Recent Eye Exam: >1 year ago Current exercise: none Current diet habits: in general, an "unhealthy" diet  Pertinent Labs: Lab Results  Component Value Date   CHOL 157 03/21/2020   HDL 30 (L) 03/21/2020   LDLCALC 73 03/21/2020   TRIG 338 (H) 03/21/2020   CHOLHDL 5.2 (H) 03/21/2020   Lab Results  Component Value Date   NA 140 03/21/2020   K 4.2 03/21/2020   CREATININE 0.98 03/21/2020   GFRNONAA 88 03/21/2020   GFRAA 102 03/21/2020   GLUCOSE 106 (H) 03/21/2020     ---------------------------------------------------------------------------------------------------  Hypertension, follow-up  BP Readings from Last 3 Encounters:  12/27/20 130/87  08/23/20 129/85  05/19/20 134/89   Wt Readings from Last 3 Encounters:  12/27/20 (!) 309 lb 12.8 oz (140.5 kg)  08/23/20 (!) 311 lb (141.1 kg)   05/19/20 (!) 305 lb 6.4 oz (138.5 kg)     He was last seen for hypertension 4 months ago.  BP at that visit was 129/85. Management since that visit includes continue same medications.  He reports good compliance with treatment. He is not having side effects.  He is following a Regular diet. He is not exercising. He does not smoke.  Use of agents associated with hypertension: none.   Outside blood pressures are not checked. Symptoms: No chest pain No chest pressure  No palpitations No syncope  No dyspnea No orthopnea  No paroxysmal nocturnal dyspnea No lower extremity edema   Pertinent labs: Lab Results  Component Value Date   CHOL 157 03/21/2020   HDL 30 (L) 03/21/2020   LDLCALC 73 03/21/2020   TRIG 338 (H) 03/21/2020   CHOLHDL 5.2 (H) 03/21/2020   Lab Results  Component Value Date   NA 140 03/21/2020   K 4.2 03/21/2020   CREATININE 0.98 03/21/2020   GFRNONAA 88 03/21/2020   GFRAA 102 03/21/2020   GLUCOSE 106 (H) 03/21/2020     The 10-year ASCVD risk score Mikey Bussing DC Jr., et al., 2013) is: 11.4%   ---------------------------------------------------------------------------------------------------     Medications: Outpatient Medications Prior to Visit  Medication Sig  . allopurinol (ZYLOPRIM) 300 MG tablet TAKE 1 TABLET (300 MG TOTAL) BY MOUTH DAILY.  . fluticasone (FLONASE) 50 MCG/ACT nasal spray Place 2 sprays into  both nostrils daily.  Marland Kitchen gabapentin (NEURONTIN) 300 MG capsule TAKE 1 CAPSULE BY MOUTH EVERYDAY AT BEDTIME  . glucose blood test strip Use to check sugar daily for type 2 diabetes E11.9  . Insulin Pen Needle (NOVOFINE) 32G X 6 MM MISC Use to check blood sugar daily for type 2 diabetes.  . metFORMIN (GLUCOPHAGE-XR) 500 MG 24 hr tablet Take 2 tablets (1,000 mg total) by mouth 2 (two) times daily before a meal.  . ONETOUCH DELICA LANCETS FINE MISC Use to check blood sugar daily  . OZEMPIC, 1 MG/DOSE, 2 MG/1.5ML SOPN INJECT 1MG  INTO THE SKIN ONCE A WEEK AS  DIRECTED  . rosuvastatin (CRESTOR) 10 MG tablet TAKE 1 TABLET BY MOUTH EVERY DAY  . valsartan (DIOVAN) 80 MG tablet Take 1 tablet (80 mg total) by mouth daily.  . vitamin B-12 (CYANOCOBALAMIN) 1000 MCG tablet Take 1,000 mcg by mouth daily.  . [DISCONTINUED] benzonatate (TESSALON) 200 MG capsule Take 1 capsule (200 mg total) by mouth 3 (three) times daily as needed. (Patient not taking: Reported on 12/27/2020)   No facility-administered medications prior to visit.    Review of Systems  Constitutional: Negative for appetite change, chills and fever.  Respiratory: Negative for chest tightness, shortness of breath and wheezing.   Cardiovascular: Negative for chest pain and palpitations.  Gastrointestinal: Negative for abdominal pain, nausea and vomiting.  Neurological: Positive for numbness (in feet and fingers).       Objective    BP 130/87 (BP Location: Left Arm, Patient Position: Sitting, Cuff Size: Large)   Pulse 71   Resp 18   Wt (!) 309 lb 12.8 oz (140.5 kg)   BMI 47.10 kg/m     Physical Exam    General: Appearance:    Obese male in no acute distress  Eyes:    PERRL, conjunctiva/corneas clear, EOM's intact       Lungs:     Clear to auscultation bilaterally, respirations unlabored  Heart:    Normal heart rate. Normal rhythm. No murmurs, rubs, or gallops.   MS:   All extremities are intact.   Neurologic:   Awake, alert, oriented x 3. No apparent focal neurological           defect.        Results for orders placed or performed in visit on 12/27/20  POCT HgB A1C  Result Value Ref Range   Hemoglobin A1C 6.2 (A) 4.0 - 5.6 %   Est. average glucose Bld gHb Est-mCnc 131     Assessment & Plan     1. Diabetes mellitus due to underlying condition with diabetic autonomic neuropathy, without long-term current use of insulin ([place]) Very well controlled, Continue current medications.  Reminded he needs eye exam.  Gabapentin working well for neuropathy.   2. Morbid obesity  ([place]) Continue Ozempic, work on diet and exercise.   3. Essential hypertension Well controlled.  Continue current medications.    4. Personal history of gout Well controlled  5. History of adenomatous polyp of colon Reminded of importance of follow up colonoscopy to reduce risk of colon cancer and given contact information to [place] in 3-4 months.       The entirety of the information documented in the History of Present Illness, Review of Systems and Physical Exam were personally obtained by me. Portions of this information were initially documented by the CMA and reviewed by me for thoroughness and accuracy.      Lelon Huh,  MD  [facility] [phone] (phone) [phone] (fax)  [place]

## 2020-12-27 ENCOUNTER — Encounter: Payer: Self-pay | Admitting: Family Medicine

## 2020-12-27 ENCOUNTER — Ambulatory Visit: Payer: BC Managed Care – PPO | Admitting: Family Medicine

## 2020-12-27 ENCOUNTER — Other Ambulatory Visit: Payer: Self-pay

## 2020-12-27 VITALS — BP 130/87 | HR 71 | Resp 18 | Wt 309.8 lb

## 2020-12-27 DIAGNOSIS — I10 Essential (primary) hypertension: Secondary | ICD-10-CM | POA: Diagnosis not present

## 2020-12-27 DIAGNOSIS — E0843 Diabetes mellitus due to underlying condition with diabetic autonomic (poly)neuropathy: Secondary | ICD-10-CM | POA: Diagnosis not present

## 2020-12-27 DIAGNOSIS — Z86010 Personal history of colonic polyps: Secondary | ICD-10-CM

## 2020-12-27 DIAGNOSIS — E119 Type 2 diabetes mellitus without complications: Secondary | ICD-10-CM

## 2020-12-27 DIAGNOSIS — Z8739 Personal history of other diseases of the musculoskeletal system and connective tissue: Secondary | ICD-10-CM

## 2020-12-27 LAB — POCT GLYCOSYLATED HEMOGLOBIN (HGB A1C)
Est. average glucose Bld gHb Est-mCnc: 131
Hemoglobin A1C: 6.2 % — AB (ref 4.0–5.6)

## 2020-12-27 NOTE — Patient Instructions (Addendum)
   Call [facility] at [phone] to schedule follow up colonoscopy since you have a high risk of developing cancerous polyps that need to be detected and removed at the earliest stage possible.   . Please contact your eyecare professional to schedule a routine eye exam. I recommend seeing Dr. Jomarie Longs at [phone] or the [facility] at [phone]

## 2021-01-08 ENCOUNTER — Other Ambulatory Visit: Payer: Self-pay | Admitting: Family Medicine

## 2021-03-06 ENCOUNTER — Other Ambulatory Visit: Payer: Self-pay | Admitting: Family Medicine

## 2021-03-06 DIAGNOSIS — I10 Essential (primary) hypertension: Secondary | ICD-10-CM

## 2021-03-06 DIAGNOSIS — E781 Pure hyperglyceridemia: Secondary | ICD-10-CM

## 2021-03-06 DIAGNOSIS — E119 Type 2 diabetes mellitus without complications: Secondary | ICD-10-CM

## 2021-03-06 NOTE — Telephone Encounter (Signed)
Requested Prescriptions  Pending Prescriptions Disp Refills  . valsartan (DIOVAN) 80 MG tablet [Pharmacy Med Name: VALSARTAN 80 MG TABLET] 90 tablet 0    Sig: TAKE 1 TABLET BY MOUTH EVERY DAY     Cardiovascular:  Angiotensin Receptor Blockers Failed - 03/06/2021  1:39 AM      Failed - Cr in normal range and within 180 days    Creat  Date Value Ref Range Status  07/03/2017 1.09 0.60 - 1.35 mg/dL Final   Creatinine, Ser  Date Value Ref Range Status  03/21/2020 0.98 0.76 - 1.27 mg/dL Final   Creatinine, POC  Date Value Ref Range Status  11/05/2017 n/a mg/dL Final         Failed - K in normal range and within 180 days    Potassium  Date Value Ref Range Status  03/21/2020 4.2 3.5 - 5.2 mmol/L Final         Passed - Patient is not pregnant      Passed - Last BP in normal range    BP Readings from Last 1 Encounters:  12/27/20 130/87         Passed - Valid encounter within last 6 months    Recent Outpatient Visits          2 months ago Diabetes mellitus due to underlying condition with diabetic autonomic neuropathy, without long-term current use of insulin [facility])   [facility] Birdie Sons, MD   4 months ago Suspected COVID-19 virus infection   [facility] [place], [place]   6 months ago Diabetes mellitus without complication [facility])   [facility] Birdie Sons, MD   9 months ago Essential hypertension   [facility] Birdie Sons, MD   11 months ago Diabetes mellitus without complication [facility])   [facility] Birdie Sons, MD      Future Appointments            In 1 month Fisher, Kirstie Peri, MD [facility]           . rosuvastatin (CRESTOR) 10 MG tablet [Pharmacy Med Name: ROSUVASTATIN CALCIUM 10 MG TAB] 90 tablet 2    Sig: TAKE 1 TABLET BY MOUTH EVERY DAY     Cardiovascular:  Antilipid - Statins Failed - 03/06/2021  1:39 AM      Failed - HDL in normal  range and within 360 days    HDL  Date Value Ref Range Status  03/21/2020 30 (L) >39 mg/dL Final         Failed - Triglycerides in normal range and within 360 days    Triglycerides  Date Value Ref Range Status  03/21/2020 338 (H) 0 - 149 mg/dL Final         Passed - Total Cholesterol in normal range and within 360 days    Cholesterol, Total  Date Value Ref Range Status  03/21/2020 157 100 - 199 mg/dL Final         Passed - LDL in normal range and within 360 days    LDL Chol Calc (NIH)  Date Value Ref Range Status  03/21/2020 73 0 - 99 mg/dL Final         Passed - Patient is not pregnant      Passed - Valid encounter within last 12 months    Recent Outpatient Visits          2 months ago Diabetes mellitus due to  underlying condition with diabetic autonomic neuropathy, without long-term current use of insulin [facility])   [facility] Birdie Sons, MD   4 months ago Suspected COVID-19 virus infection   [facility] [place], [place]   6 months ago Diabetes mellitus without complication [facility]   [facility] Birdie Sons, MD   9 months ago Essential hypertension   [facility] Birdie Sons, MD   11 months ago Diabetes mellitus without complication [facility])   [facility] Birdie Sons, MD      Future Appointments            In 1 month Fisher, Kirstie Peri, MD [facility]           . metFORMIN (GLUCOPHAGE-XR) 500 MG 24 hr tablet [Pharmacy Med Name: METFORMIN HCL ER 500 MG TABLET] 360 tablet 1    Sig: TAKE 2 TABLETS (1,000 MG TOTAL) BY MOUTH 2 (TWO) TIMES DAILY BEFORE A MEAL.     Endocrinology:  Diabetes - Biguanides Passed - 03/06/2021  1:39 AM      Passed - Cr in normal range and within 360 days    Creat  Date Value Ref Range Status  07/03/2017 1.09 0.60 - 1.35 mg/dL Final   Creatinine, Ser  Date Value Ref Range Status  03/21/2020 0.98 0.76 - 1.27 mg/dL Final    Creatinine, POC  Date Value Ref Range Status  11/05/2017 n/a mg/dL Final         Passed - HBA1C is between 0 and 7.9 and within 180 days    Hemoglobin A1C  Date Value Ref Range Status  12/27/2020 6.2 (A) 4.0 - 5.6 % Final         Passed - eGFR in normal range and within 360 days    GFR, Est African American  Date Value Ref Range Status  07/03/2017 92 > OR = 60 mL/min/1.32m Final   GFR calc Af Amer  Date Value Ref Range Status  03/21/2020 102 >59 mL/min/1.73 Final    Comment:    **[facility] currently reports eGFR in compliance with the current**   recommendations of the [employer] [facility] will   update reporting as new guidelines are published from the NKF-ASN   Task force.    GFR, Est Non African American  Date Value Ref Range Status  07/03/2017 79 > OR = 60 mL/min/1.779mFinal   GFR calc non Af Amer  Date Value Ref Range Status  03/21/2020 88 >59 mL/min/1.73 Final         Passed - Valid encounter within last 6 months    Recent Outpatient Visits          2 months ago Diabetes mellitus due to underlying condition with diabetic autonomic neuropathy, without long-term current use of [facility] ([facility]   4 months ago Suspected COVID-19 virus infection   [facility] 6 months ago [facility]   9 months ago Essential hypertension   [facility]   11 months ago Diabetes mellitus without complication ([facility]  [facility]      Future Appointments            In 1 month Fisher, DoKirstie PeriMD [facility]

## 2021-05-01 ENCOUNTER — Encounter: Payer: Self-pay | Admitting: Family Medicine

## 2021-05-09 ENCOUNTER — Other Ambulatory Visit: Payer: Self-pay | Admitting: Family Medicine

## 2021-05-09 NOTE — Telephone Encounter (Signed)
Requested medications are due for refill today.  yes  Requested medications are on the active medications list.  yes  Last refill. 02/16/2020  Future visit scheduled.   yes  Notes to clinic.  Prescription is expired.

## 2021-05-31 ENCOUNTER — Other Ambulatory Visit: Payer: Self-pay | Admitting: Family Medicine

## 2021-05-31 DIAGNOSIS — E119 Type 2 diabetes mellitus without complications: Secondary | ICD-10-CM

## 2021-05-31 NOTE — Telephone Encounter (Signed)
Requested medications are due for refill today yes  Requested medications are on the active medication list yes  Last refill 03/06/21  Last visit 12/2020  Future visit scheduled 07/27/21  Notes to clinic Pharmacy requesting dx code.

## 2021-06-02 ENCOUNTER — Other Ambulatory Visit: Payer: Self-pay | Admitting: Family Medicine

## 2021-06-02 NOTE — Telephone Encounter (Signed)
last RF 01/08/21 #90 1 RF

## 2021-07-06 ENCOUNTER — Other Ambulatory Visit: Payer: Self-pay | Admitting: Family Medicine

## 2021-07-06 NOTE — Telephone Encounter (Signed)
Pt called stating that he only has 2 pills left of this medication. Please advise.

## 2021-07-27 ENCOUNTER — Ambulatory Visit (INDEPENDENT_AMBULATORY_CARE_PROVIDER_SITE_OTHER): Payer: BC Managed Care – PPO | Admitting: Family Medicine

## 2021-07-27 ENCOUNTER — Encounter: Payer: Self-pay | Admitting: Family Medicine

## 2021-07-27 ENCOUNTER — Other Ambulatory Visit: Payer: Self-pay

## 2021-07-27 VITALS — BP 131/84 | HR 68 | Temp 97.8°F | Resp 16 | Ht 68.0 in | Wt 309.0 lb

## 2021-07-27 DIAGNOSIS — I10 Essential (primary) hypertension: Secondary | ICD-10-CM | POA: Diagnosis not present

## 2021-07-27 DIAGNOSIS — E781 Pure hyperglyceridemia: Secondary | ICD-10-CM | POA: Diagnosis not present

## 2021-07-27 DIAGNOSIS — Z1159 Encounter for screening for other viral diseases: Secondary | ICD-10-CM

## 2021-07-27 DIAGNOSIS — E1121 Type 2 diabetes mellitus with diabetic nephropathy: Secondary | ICD-10-CM

## 2021-07-27 DIAGNOSIS — Z860100 Personal history of colon polyps, unspecified: Secondary | ICD-10-CM

## 2021-07-27 DIAGNOSIS — Z0000 Encounter for general adult medical examination without abnormal findings: Secondary | ICD-10-CM

## 2021-07-27 DIAGNOSIS — Z1211 Encounter for screening for malignant neoplasm of colon: Secondary | ICD-10-CM

## 2021-07-27 DIAGNOSIS — Z125 Encounter for screening for malignant neoplasm of prostate: Secondary | ICD-10-CM

## 2021-07-27 NOTE — Progress Notes (Signed)
Complete physical exam   Patient: Adam Wolfe   DOB: November 28, 1965   55 y.o. Male  MRN: [id number] Visit Date: 07/27/2021  Today's healthcare provider: Lelon Huh, MD   Chief Complaint  Patient presents with   Annual Exam   Diabetes   Hypertension   Subjective    Adam Wolfe is a 55 y.o. male who presents today for a complete physical exam.  He reports consuming a general diet. The patient does not participate in regular exercise at present. He generally feels fairly well. He reports sleeping fairly well. He does not have additional problems to discuss today.  HPI  Diabetes Mellitus Type II, Follow-up  Lab Results  Component Value Date   HGBA1C 6.2 (A) 12/27/2020   HGBA1C 6.9 (A) 08/23/2020   HGBA1C 6.2 (A) 03/21/2020   Wt Readings from Last 3 Encounters:  07/27/21 (!) 309 lb (140.2 kg)  12/27/20 (!) 309 lb 12.8 oz (140.5 kg)  08/23/20 (!) 311 lb (141.1 kg)   Last seen for diabetes 5 months ago.  Management since then includes continue same medications. He reports good compliance with treatment. He is not having side effects.  Symptoms: No fatigue No foot ulcerations  No appetite changes No nausea  No paresthesia of the feet  No polydipsia  No polyuria No visual disturbances   No vomiting     Home blood sugar records:  blood sugars are not checked  Episodes of hypoglycemia? No    Current insulin regiment: none Most Recent Eye Exam: not UTD Current exercise: none Current diet habits: in general, an "unhealthy" diet   ---------------------------------------------------------------------------------------------------     Hypertension, follow-up  BP Readings from Last 3 Encounters:  07/27/21 131/84  12/27/20 130/87  08/23/20 129/85   Wt Readings from Last 3 Encounters:  07/27/21 (!) 309 lb (140.2 kg)  12/27/20 (!) 309 lb 12.8 oz (140.5 kg)  08/23/20 (!) 311 lb (141.1 kg)     He was last seen for hypertension 7 months ago.  BP at that  visit was 130/87. Management since that visit includes continuing same medications.  He reports good compliance with treatment. He is not having side effects.  He is following a Regular diet. He is not exercising. He does not smoke.  Use of agents associated with hypertension: none.   Outside blood pressures are not checked. Symptoms: No chest pain No chest pressure  No palpitations No syncope  No dyspnea No orthopnea  No paroxysmal nocturnal dyspnea No lower extremity edema   Pertinent labs: Lab Results  Component Value Date   NA 140 03/21/2020   K 4.2 03/21/2020   CREATININE 0.98 03/21/2020   GFRNONAA 88 03/21/2020   GLUCOSE 106 (H) 03/21/2020     The 10-year ASCVD risk score (Arnett DK, et al., 2019) is: 12.7%   ---------------------------------------------------------------------------------------------------   Lipid/Cholesterol, Follow-up  Last lipid panel Other pertinent labs  Lab Results  Component Value Date   CHOL 157 03/21/2020   HDL 30 (L) 03/21/2020   LDLCALC 73 03/21/2020   TRIG 338 (H) 03/21/2020   CHOLHDL 5.2 (H) 03/21/2020   Lab Results  Component Value Date   ALT 32 03/21/2020   AST 27 03/21/2020   PLT 247 05/02/2016   TSH 2.64 09/02/2013     He was last seen for this 1  year  ago.  Management since that visit includes no changes.  He reports good compliance with treatment. He is not having side effects.   Symptoms:  No chest pain No chest pressure/discomfort  No dyspnea No lower extremity edema  No numbness or tingling of extremity No orthopnea  No palpitations No paroxysmal nocturnal dyspnea  No speech difficulty No syncope   Current diet: in general, an "unhealthy" diet Current exercise: none  The 10-year ASCVD risk score (Arnett DK, et al., 2019) is: 12.7%  ---------------------------------------------------------------------------------------------------   Past Medical History:  Diagnosis Date   Diabetes mellitus without  complication ([place])    Gout    History of chicken pox    Sleep apnea    Past Surgical History:  Procedure Laterality Date   COLONOSCOPY WITH PROPOFOL N/A 07/24/2018   Procedure: COLONOSCOPY WITH PROPOFOL;  Surgeon: Jonathon Bellows, MD;  [facility] END[facility]COPY;  Service: Gastroenterology;  Laterality: N/A;   Overnight Oximetry  04/13/2004   showed sleep apnea- Sleep study was done 04/30/2004 showed severe Obstructive sleep apnea   sleep study  04/30/2004   Severe obstructive sleep apnea with nocturnal desaturations- started CPAP-13   Social History   Socioeconomic History   Marital status: Married    Spouse name: Not on file   Number of children: Not on file   Years of education: Not on file   Highest education level: Not on file  Occupational History   Occupation: Customer service manager: [employer]  Tobacco Use   Smoking status: Never   Smokeless tobacco: Current    Types: Chew  Substance and Sexual Activity   Alcohol use: Not Currently    Alcohol/week: 0.0 standard drinks   Drug use: No   Sexual activity: Not on file  Other Topics Concern   Not on file  Social History Narrative   CPAP- 13cm pressure   Social Determinants of Health   Financial Resource Strain: Not on file  Food Insecurity: Not on file  Transportation Needs: Not on file  Physical Activity: Not on file  Stress: Not on file  Social Connections: Not on file  Intimate Partner Violence: Not on file   Family Status  Relation Name Status   Other gen. family hx Alive   Mother  Alive   Father  Deceased at age 74   Sister  Alive   Daughter  Alive   Daughter  Alive   Family History  Problem Relation Age of Onset   COPD Other        smoker   Hypertension Mother    Cancer Father    COPD Father    Allergies  Allergen Reactions   Lasix [Furosemide] Hives    Patient Care Team: Birdie Sons, MD as PCP - General (Family Medicine)   Medications: Outpatient Medications Prior to  Visit  Medication Sig   allopurinol (ZYLOPRIM) 300 MG tablet TAKE 1 TABLET (300 MG TOTAL) BY MOUTH DAILY.   fluticasone (FLONASE) 50 MCG/ACT nasal spray Place 2 sprays into both nostrils daily.   gabapentin (NEURONTIN) 300 MG capsule TAKE 1 CAPSULE BY MOUTH EVERYDAY AT BEDTIME   glucose blood test strip Use to check sugar daily for type 2 diabetes E11.9   Insulin Pen Needle (NOVOFINE) 32G X 6 MM MISC Use to check blood sugar daily for type 2 diabetes.   metFORMIN (GLUCOPHAGE-XR) 500 MG 24 hr tablet Take 2 tablets (1,000 mg total) by mouth 2 (two) times daily before a meal.   ONETOUCH DELICA LANCETS FINE MISC Use to check blood sugar daily   OZEMPIC, 1 MG/DOSE, 2 MG/1.5ML SOPN INJECT 1MG  INTO THE SKIN ONCE A WEEK  AS DIRECTED   rosuvastatin (CRESTOR) 10 MG tablet TAKE 1 TABLET BY MOUTH EVERY DAY   valsartan (DIOVAN) 80 MG tablet TAKE 1 TABLET BY MOUTH EVERY DAY   vitamin B-12 (CYANOCOBALAMIN) 1000 MCG tablet Take 1,000 mcg by mouth daily.   No facility-administered medications prior to visit.    Review of Systems  Constitutional:  Negative for appetite change, chills, fatigue and fever.  HENT:  Negative for congestion, ear pain, hearing loss, nosebleeds and trouble swallowing.   Eyes:  Negative for pain and visual disturbance.  Respiratory:  Negative for cough, chest tightness, shortness of breath and wheezing.   Cardiovascular:  Negative for chest pain, palpitations and leg swelling.  Gastrointestinal:  Negative for abdominal pain, blood in stool, constipation, diarrhea, nausea and vomiting.  Endocrine: Negative for polydipsia, polyphagia and polyuria.  Genitourinary:  Negative for dysuria and flank pain.  Musculoskeletal:  Negative for arthralgias, back pain, joint swelling, myalgias and neck stiffness.  Skin:  Negative for color change, rash and wound.  Neurological:  Negative for dizziness, tremors, seizures, speech difficulty, weakness, light-headedness and headaches.   Psychiatric/Behavioral:  Negative for behavioral problems, confusion, decreased concentration, dysphoric mood and sleep disturbance. The patient is not nervous/anxious.   All other systems reviewed and are negative.    Objective    BP 131/84 (BP Location: Left Arm, Patient Position: Sitting, Cuff Size: Large)   Pulse 68   Temp 97.8 F (36.6 C) (Oral)   Resp 16   Ht 5\' 8"  (1.727 m)   Wt (!) 309 lb (140.2 kg)   SpO2 98% Comment: room air  BMI 46.98 kg/m    Physical Exam   General Appearance:    Severely obese male. Alert, cooperative, in no acute distress, appears stated age  Head:    Normocephalic, without obvious abnormality, atraumatic  Eyes:    PERRL, conjunctiva/corneas clear, EOM's intact, fundi    benign, both eyes       Ears:    Normal TM's and external ear canals, both ears  Neck:   Supple, symmetrical, trachea midline, no adenopathy;       thyroid:  No enlargement/tenderness/nodules; no carotid   bruit or JVD  Back:     Symmetric, no curvature, ROM normal, no CVA tenderness  Lungs:     Clear to auscultation bilaterally, respirations unlabored  Chest wall:    No tenderness or deformity  Heart:    Normal heart rate. Normal rhythm. No murmurs, rubs, or gallops.  S1 and S2 normal  Abdomen:     Soft, non-tender, bowel sounds active all four quadrants,    no masses, no organomegaly. Medium sized umbilical hernia.   Genitalia:    deferred  Rectal:    deferred  Extremities:   All extremities are intact. No cyanosis or edema  Pulses:   2+ and symmetric all extremities  Skin:   Skin color, texture, turgor normal, no rashes or lesions  Lymph nodes:   Cervical, supraclavicular, and axillary nodes normal  Neurologic:   CNII-XII intact. Normal strength, sensation and reflexes      throughout     Last depression screening scores PHQ 2/9 Scores 07/27/2021 12/27/2020 03/21/2020  PHQ - 2 Score 0 0 0  PHQ- 9 Score 1 1 -   Last fall risk screening Fall Risk  07/27/2021  Falls in  the past year? 0  Number falls in past yr: 0  Injury with Fall? 0  Risk for fall due to : No Fall Risks  Risk for fall due to: Comment -  Follow up Falls evaluation completed   Last Audit-C alcohol use screening Alcohol Use Disorder Test (AUDIT) 07/27/2021  1. How often do you have a drink containing alcohol? 1  2. How many drinks containing alcohol do you have on a typical day when you are drinking? 0  3. How often do you have six or more drinks on one occasion? 0  AUDIT-C Score 1  Alcohol Brief Interventions/Follow-up -   A score of 3 or more in women, and 4 or more in men indicates increased risk for alcohol abuse, EXCEPT if all of the points are from question 1   No results found for any visits on 07/27/21.  Assessment & Plan    Routine Health Maintenance and Physical Exam  Exercise Activities and Dietary recommendations  Goals   None     Immunization History  Administered Date(s) Administered   Influenza,inj,Quad PF,6+ Mos 07/03/2017, 08/26/2018   Influenza-Unspecified 08/26/2018   Tdap 08/26/2018    Health Maintenance  Topic Date Due   COVID-19 Vaccine (1) Never done   FOOT EXAM  Never done   HIV Screening  Never done   Hepatitis C Screening  Never done   Zoster Vaccines- Shingrix (1 of 2) Never done   OPHTHALMOLOGY EXAM  01/30/2019   COLONOSCOPY (Pts 45-14yrs Insurance coverage will need to be confirmed)  07/25/2019   HEMOGLOBIN A1C  06/29/2021   INFLUENZA VACCINE  01/11/2022 (Originally 05/14/2021)   TETANUS/TDAP  08/26/2028   HPV VACCINES  Aged Out    Discussed health benefits of physical activity, and encouraged him to engage in regular exercise appropriate for his age and condition.  1. Essential hypertension Fairly well controlled. Continue current medications.   - EKG 12-Lead  2. Diabetes mellitus with nephropathy (HCC)  - Hemoglobin A1c  3. Hypertriglyceridemia He is tolerating rosuvastatin well with no adverse effects.   - Comprehensive  metabolic panel - CBC - Lipid panel  4. Morbid obesity ([place]) Encouraged healthy diet and exercise.   5. Need for hepatitis C screening test  - Hepatitis C antibody  6. Prostate cancer screening  - PSA Total (Reflex To Free) ([facility] only)  7. Personal history of colonic polyps  - Ambulatory referral to gastroenterology for colonoscopy  8. Colon cancer screening  - Ambulatory referral to gastroenterology for colonoscopy  Counseled patient that he is at high risk for colon cancer and strongly advised to follow up with GI for  colonoscopy.   He declined flu vaccine      The entirety of the information documented in the History of Present Illness, Review of Systems and Physical Exam were personally obtained by me. Portions of this information were initially documented by the CMA and reviewed by me for thoroughness and accuracy.     Lelon Huh, MD  [facility] [phone] (phone) [phone] (fax)  [place]

## 2021-07-27 NOTE — Patient Instructions (Addendum)
Please review the attached list of medications and notify my office if there are any errors.   You are overdue for a colonoscopy due to family history of colon cancer and having many polyps on your last one in 2019. Your risk of developing colon cancer is very high if you don't keep up to date on the screenings.

## 2021-07-28 LAB — COMPREHENSIVE METABOLIC PANEL
ALT: 39 IU/L (ref 0–44)
AST: 31 IU/L (ref 0–40)
Albumin/Globulin Ratio: 1.7 (ref 1.2–2.2)
Albumin: 4 g/dL (ref 3.8–4.9)
Alkaline Phosphatase: 53 IU/L (ref 44–121)
BUN/Creatinine Ratio: 12 (ref 9–20)
BUN: 12 mg/dL (ref 6–24)
Bilirubin Total: 0.5 mg/dL (ref 0.0–1.2)
CO2: 22 mmol/L (ref 20–29)
Calcium: 9.4 mg/dL (ref 8.7–10.2)
Chloride: 105 mmol/L (ref 96–106)
Creatinine, Ser: 1 mg/dL (ref 0.76–1.27)
Globulin, Total: 2.3 g/dL (ref 1.5–4.5)
Glucose: 122 mg/dL — ABNORMAL HIGH (ref 70–99)
Potassium: 4.7 mmol/L (ref 3.5–5.2)
Sodium: 140 mmol/L (ref 134–144)
Total Protein: 6.3 g/dL (ref 6.0–8.5)
eGFR: 90 mL/min/{1.73_m2} (ref >59)

## 2021-07-28 LAB — CBC
Hematocrit: 45 % (ref 37.5–51.0)
Hemoglobin: 15.7 g/dL (ref 13.0–17.7)
MCH: 31.8 pg (ref 26.6–33.0)
MCHC: 34.9 g/dL (ref 31.5–35.7)
MCV: 91 fL (ref 79–97)
Platelets: 194 10*3/uL (ref 150–450)
RBC: 4.94 x10E6/uL (ref 4.14–5.80)
RDW: 13.3 % (ref 11.6–15.4)
WBC: 7.2 10*3/uL (ref 3.4–10.8)

## 2021-07-28 LAB — LIPID PANEL
Chol/HDL Ratio: 3 ratio (ref 0.0–5.0)
Cholesterol, Total: 94 mg/dL — ABNORMAL LOW (ref 100–199)
HDL: 31 mg/dL — ABNORMAL LOW (ref >39)
LDL Chol Calc (NIH): 38 mg/dL (ref 0–99)
Triglycerides: 147 mg/dL (ref 0–149)
VLDL Cholesterol Cal: 25 mg/dL (ref 5–40)

## 2021-07-28 LAB — HEMOGLOBIN A1C
Est. average glucose Bld gHb Est-mCnc: 151 mg/dL
Hgb A1c MFr Bld: 6.9 % — ABNORMAL HIGH (ref 4.8–5.6)

## 2021-07-28 LAB — HEPATITIS C ANTIBODY: Hep C Virus Ab: <0.1 s/co ratio (ref 0.0–0.9)

## 2021-07-28 LAB — PSA TOTAL (REFLEX TO FREE): Prostate Specific Ag, Serum: 0.8 ng/mL (ref 0.0–4.0)

## 2021-08-06 ENCOUNTER — Other Ambulatory Visit: Payer: Self-pay | Admitting: Family Medicine

## 2021-08-06 DIAGNOSIS — I10 Essential (primary) hypertension: Secondary | ICD-10-CM

## 2021-08-10 ENCOUNTER — Telehealth: Payer: Self-pay | Admitting: Family Medicine

## 2021-08-10 NOTE — Telephone Encounter (Signed)
Please advise patient, cholesterol is very good at 94. PSA, kidney functions, and electrolytes are all normal.  A1c has gone back up to 6.9%. Try to be more strict with our diet and exercise every day. Please schedule a follow up appointment for diabetes in 6 months

## 2021-08-10 NOTE — Telephone Encounter (Signed)
Patient advised and verbalized understanding. Follow up appointment scheduled.

## 2021-08-25 ENCOUNTER — Other Ambulatory Visit: Payer: Self-pay | Admitting: Family Medicine

## 2021-08-25 DIAGNOSIS — E119 Type 2 diabetes mellitus without complications: Secondary | ICD-10-CM

## 2021-08-25 NOTE — Telephone Encounter (Signed)
Requested Prescriptions  Pending Prescriptions Disp Refills  . OZEMPIC, 1 MG/DOSE, 4 MG/3ML SOPN [Pharmacy Med Name: OZEMPIC 1 MG/DOSE (4 MG/3 ML)]  4    Sig: INJECT 1MG  INTO THE SKIN ONCE A WEEK AS DIRECTED     Endocrinology:  Diabetes - GLP-1 Receptor Agonists Passed - 08/25/2021 11:01 AM      Passed - HBA1C is between 0 and 7.9 and within 180 days    Hgb A1c MFr Bld  Date Value Ref Range Status  07/27/2021 6.9 (H) 4.8 - 5.6 % Final    Comment:             Prediabetes: 5.7 - 6.4          Diabetes: >6.4          Glycemic control for adults with diabetes: <7.0          Passed - Valid encounter within last 6 months    Recent Outpatient Visits          4 weeks ago Annual physical exam   [facility] Birdie Sons, MD   8 months ago Diabetes mellitus due to underlying condition with diabetic autonomic neuropathy, without long-term current use of insulin [facility])   [facility] Birdie Sons, MD   10 months ago Suspected COVID-19 virus infection   [facility], [place], [place]   1 year ago Diabetes mellitus without complication [facility])   [facility] Birdie Sons, MD   1 year ago Essential hypertension   [facility] Birdie Sons, MD      Future Appointments            In 5 months Fisher, Kirstie Peri, MD [facility], [place]

## 2021-08-30 ENCOUNTER — Other Ambulatory Visit: Payer: Self-pay | Admitting: Family Medicine

## 2021-08-30 DIAGNOSIS — E119 Type 2 diabetes mellitus without complications: Secondary | ICD-10-CM

## 2021-08-31 NOTE — Telephone Encounter (Signed)
Pharm requesting alternative med due to this one being on back order.   Notes to clinic Please assess.

## 2021-09-08 ENCOUNTER — Other Ambulatory Visit: Payer: Self-pay | Admitting: Family Medicine

## 2021-09-08 DIAGNOSIS — E119 Type 2 diabetes mellitus without complications: Secondary | ICD-10-CM

## 2021-09-09 NOTE — Telephone Encounter (Signed)
Requested Prescriptions  Pending Prescriptions Disp Refills  . metFORMIN (GLUCOPHAGE-XR) 500 MG 24 hr tablet [Pharmacy Med Name: METFORMIN HCL ER 500 MG TABLET] 360 tablet 0    Sig: TAKE 2 TABLETS (1,000 MG TOTAL) BY MOUTH 2 (TWO) TIMES DAILY BEFORE A MEAL.     Endocrinology:  Diabetes - Biguanides Passed - 09/08/2021  5:34 PM      Passed - Cr in normal range and within 360 days    Creat  Date Value Ref Range Status  07/03/2017 1.09 0.60 - 1.35 mg/dL Final   Creatinine, Ser  Date Value Ref Range Status  07/27/2021 1.00 0.76 - 1.27 mg/dL Final   Creatinine, POC  Date Value Ref Range Status  11/05/2017 n/a mg/dL Final         Passed - HBA1C is between 0 and 7.9 and within 180 days    Hgb A1c MFr Bld  Date Value Ref Range Status  07/27/2021 6.9 (H) 4.8 - 5.6 % Final    Comment:             Prediabetes: 5.7 - 6.4          Diabetes: >6.4          Glycemic control for adults with diabetes: <7.0          Passed - eGFR in normal range and within 360 days    GFR, Est African American  Date Value Ref Range Status  07/03/2017 92 > OR = 60 mL/min/1.56m Final   GFR calc Af Amer  Date Value Ref Range Status  03/21/2020 102 >59 mL/min/1.73 Final    Comment:    **[facility] currently reports eGFR in compliance with the current**   recommendations of the [employer] [facility] will   update reporting as new guidelines are published from the NKF-ASN   Task force.    GFR, Est Non African American  Date Value Ref Range Status  07/03/2017 79 > OR = 60 mL/min/1.746mFinal   GFR calc non Af Amer  Date Value Ref Range Status  03/21/2020 88 >59 mL/min/1.73 Final   eGFR  Date Value Ref Range Status  07/27/2021 90 >59 mL/min/1.73 Final         Passed - Valid encounter within last 6 months    Recent Outpatient Visits          1 month ago Annual physical exam   [facility]   8 months ago Diabetes mellitus due to underlying condition  with diabetic autonomic neuropathy, [facility]  [facility]   10 months ago Suspected COVID-19 virus infection   [facility]  [facility]   1 year ago Essential hypertension   [facility]      Future Appointments            In 5 months Fisher, [facility]irstie PeriMD [facility]           . allopurinol (ZYLOPRIM) 300 MG tablet [Pharmacy Med Name: ALLOPURINOL 300 MG TABLET] 90 tablet 1    Sig: TAKE 1 TABLET BY MOUTH EVERY DAY     Endocrinology:  Gout Agents Failed - 09/08/2021  5:34 PM      Failed - Uric Acid in normal range and within  360 days    No results found for: POCURA, LABURIC       Passed - Cr in normal range and within 360 days    Creat  Date Value Ref Range Status  07/03/2017 1.09 0.60 - 1.35 mg/dL Final   Creatinine, Ser  Date Value Ref Range Status  07/27/2021 1.00 0.76 - 1.27 mg/dL Final   Creatinine, POC  Date Value Ref Range Status  11/05/2017 n/a mg/dL Final         Passed - Valid encounter within last 12 months    Recent Outpatient Visits          1 month ago Annual physical exam   [facility] Birdie Sons, MD   8 months ago Diabetes mellitus due to underlying condition with diabetic autonomic neuropathy, without long-term current use of insulin [facility])   [facility] Birdie Sons, MD   10 months ago Suspected COVID-19 virus infection   [facility], [place], [place]   1 year ago Diabetes mellitus without complication [facility])   [facility] Birdie Sons, MD   1 year ago Essential hypertension   [facility] Birdie Sons, MD      Future Appointments            In 5 months Fisher, Kirstie Peri, MD [facility], [place]

## 2021-10-05 LAB — HM DIABETES EYE EXAM

## 2021-12-09 ENCOUNTER — Other Ambulatory Visit: Payer: Self-pay | Admitting: Family Medicine

## 2021-12-09 DIAGNOSIS — E119 Type 2 diabetes mellitus without complications: Secondary | ICD-10-CM

## 2021-12-10 ENCOUNTER — Other Ambulatory Visit: Payer: Self-pay | Admitting: Family Medicine

## 2021-12-10 DIAGNOSIS — E781 Pure hyperglyceridemia: Secondary | ICD-10-CM

## 2021-12-10 DIAGNOSIS — E119 Type 2 diabetes mellitus without complications: Secondary | ICD-10-CM

## 2022-02-07 NOTE — Progress Notes (Signed)
?  ? ?I,Adam Wolfe,acting as a scribe for Adam Huh, MD.,have documented all relevant documentation on the behalf of Adam Brittian, MD,as directed by  Adam Huh, MD while in the presence of Adam Huh, MD. ? ? ?Established patient visit ? ? ?Patient: Adam Wolfe   DOB: [birth year]   56 y.o. Male  MRN: [id number] ?Visit Date: 02/08/2022 ? ?Today's healthcare provider: Lelon Huh, MD  ? ?Chief Complaint  ?Patient presents with  ? Diabetes  ? ?Subjective  ?  ?HPI  ?Diabetes Mellitus Type II, Follow-up ? ?Lab Results  ?Component Value Date  ? HGBA1C 6.9 (H) 07/27/2021  ? HGBA1C 6.2 (A) 12/27/2020  ? HGBA1C 6.9 (A) 08/23/2020  ? ?Wt Readings from Last 3 Encounters:  ?07/27/21 (!) 309 lb (140.2 kg)  ?12/27/20 (!) 309 lb 12.8 oz (140.5 kg)  ?08/23/20 (!) 311 lb (141.1 kg)  ? ?Last seen for diabetes 6 months ago.  ?Management since then includes advising patient to try being more strict with diet and exercise every day. ?He reports excellent compliance with treatment. ?He is not having side effects.  ?Symptoms: ?No fatigue No foot ulcerations  ?No appetite changes No nausea  ?Yes paresthesia of the feet  No polydipsia  ?No polyuria No visual disturbances   ?No vomiting   ? ? ?Home blood sugar records:  not being checked daily ? ?Episodes of hypoglycemia? No  ?  ?Current insulin regiment: none ?Most Recent Eye Exam: 10/05/2021 ?Current exercise: none ?Current diet habits: in general, an "unhealthy" diet ? ?Pertinent Labs: ?Lab Results  ?Component Value Date  ? CHOL 94 (L) 07/27/2021  ? HDL 31 (L) 07/27/2021  ? [place] 38 07/27/2021  ? TRIG 147 07/27/2021  ? CHOLHDL 3.0 07/27/2021  ? Lab Results  ?Component Value Date  ? NA 140 07/27/2021  ? K 4.7 07/27/2021  ? CREATININE 1.00 07/27/2021  ? EGFR 90 07/27/2021  ? MICROALBUR 20 12/02/2018  ?  ? ?---------------------------------------------------------------------------------------------------  ? ?Medications: ?Outpatient Medications Prior to Visit   ?Medication Sig  ? allopurinol (ZYLOPRIM) 300 MG tablet TAKE 1 TABLET BY MOUTH EVERY DAY  ? fluticasone (FLONASE) 50 MCG/ACT nasal spray Place 2 sprays into both nostrils daily.  ? gabapentin (NEURONTIN) 300 MG capsule TAKE 1 CAPSULE BY MOUTH EVERYDAY AT BEDTIME  ? glucose blood test strip Use to check sugar daily for type 2 diabetes E11.9  ? Insulin Pen Needle (NOVOFINE) 32G X 6 MM MISC Use to check blood sugar daily for type 2 diabetes.  ? metFORMIN (GLUCOPHAGE-XR) 500 MG 24 hr tablet TAKE 2 TABLETS (1,000 MG TOTAL) BY MOUTH 2 (TWO) TIMES DAILY BEFORE A MEAL.  ? ONETOUCH DELICA LANCETS FINE MISC Use to check blood sugar daily  ? OZEMPIC, 1 MG/DOSE, 4 MG/3ML SOPN INJECT 1MG INTO THE SKIN ONCE A WEEK AS DIRECTED  ? rosuvastatin (CRESTOR) 10 MG tablet TAKE 1 TABLET BY MOUTH EVERY DAY  ? valsartan (DIOVAN) 80 MG tablet TAKE 1 TABLET BY MOUTH EVERY DAY  ? vitamin B-12 (CYANOCOBALAMIN) 1000 MCG tablet Take 1,000 mcg by mouth daily.  ? ?No facility-administered medications prior to visit.  ? ? ?Review of Systems  ?Constitutional:  Positive for fatigue. Negative for activity change and appetite change.  ?Respiratory:  Negative for shortness of breath.   ?Cardiovascular:  Negative for chest pain.  ?Gastrointestinal:  Negative for nausea and vomiting.  ?Neurological:  Positive for numbness.  ? ?  ?  Objective  ?  ?There were no vitals taken for this  visit. ?BP Readings from Last 3 Encounters:  ?07/27/21 131/84  ?12/27/20 130/87  ?08/23/20 129/85  ? ?Wt Readings from Last 3 Encounters:07/27/21 ?(!) 309 lb (140.2 kg) ?12/27/20 ?(!) 309 lb 12.8 oz (140.5 kg) ?08/23/20 ?(!) 311 lb (141.1 kg)  ? ?  ? ?Physical Exam  ? ?General: Appearance:    Severely obese male in no acute distress  ?Eyes:    PERRL, conjunctiva/corneas clear, EOM's intact       ?Lungs:     Clear to auscultation bilaterally, respirations unlabored  ?Heart:    Normal heart rate. Normal rhythm. No murmurs, rubs, or gallops.    ?MS:   All extremities are intact.     ?Neurologic:   Awake, alert, oriented x 3. No apparent focal neurological defect.   ?   ?  ? ?No results found for any visits on 02/08/22. ? Assessment & Plan  ? ?1. Diabetes mellitus without complication ([place]) ?Tolerating current medications well. Increase  Semaglutide, to 2 MG/DOSE, 8 MG/3ML SOPN; Inject 2 mg as directed once a week.  Dispense: 3 mL; Refill: 0 ?- HgB A1c ? ?2. Morbid obesity ([place]) ?Continue to work on diet and exercise.  ? ?3. Essential hypertension ?Well controlled.  Continue current medications.    ?   ? ?The entirety of the information documented in the History of Present Illness, Review of Systems and Physical Exam were personally obtained by me. Portions of this information were initially documented by the CMA and reviewed by me for thoroughness and accuracy.   ? ? ?Adam Huh, MD  ?[facility] ?[phone] (phone) ?[phone] (fax) ? ?[facility]  ?

## 2022-02-08 ENCOUNTER — Encounter: Payer: Self-pay | Admitting: Family Medicine

## 2022-02-08 ENCOUNTER — Ambulatory Visit: Payer: BC Managed Care – PPO | Admitting: Family Medicine

## 2022-02-08 VITALS — BP 130/85 | HR 84 | Temp 98.0°F | Resp 16 | Ht 68.0 in | Wt 309.3 lb

## 2022-02-08 DIAGNOSIS — I10 Essential (primary) hypertension: Secondary | ICD-10-CM | POA: Diagnosis not present

## 2022-02-08 DIAGNOSIS — E119 Type 2 diabetes mellitus without complications: Secondary | ICD-10-CM | POA: Diagnosis not present

## 2022-02-08 MED ORDER — SEMAGLUTIDE (2 MG/DOSE) 8 MG/3ML ~~LOC~~ SOPN: 2.0000 mg | PEN_INJECTOR | SUBCUTANEOUS | 0 refills | Status: DC

## 2022-02-09 LAB — HEMOGLOBIN A1C
Est. average glucose Bld gHb Est-mCnc: 146 mg/dL
Hgb A1c MFr Bld: 6.7 % — ABNORMAL HIGH (ref 4.8–5.6)

## 2022-02-10 ENCOUNTER — Other Ambulatory Visit: Payer: Self-pay | Admitting: Family Medicine

## 2022-02-10 ENCOUNTER — Encounter: Payer: Self-pay | Admitting: Family Medicine

## 2022-02-10 DIAGNOSIS — Z86010 Personal history of colonic polyps: Secondary | ICD-10-CM

## 2022-02-10 DIAGNOSIS — Z1211 Encounter for screening for malignant neoplasm of colon: Secondary | ICD-10-CM

## 2022-02-11 ENCOUNTER — Telehealth: Payer: Self-pay

## 2022-02-11 NOTE — Telephone Encounter (Signed)
LVM for pt to call office. ? ?Scheduling note: Last colonoscopy was with Dr. Vicente Males 07/24/18 10 sessile polyps were noted. ? ?Thanks, ? ?Sharyn Lull, CMA ?

## 2022-02-12 ENCOUNTER — Telehealth: Payer: Self-pay

## 2022-02-12 ENCOUNTER — Other Ambulatory Visit: Payer: Self-pay | Admitting: Family Medicine

## 2022-02-12 NOTE — Telephone Encounter (Signed)
CALLED PATIENT NO ANSWER LEFT VOICEMAIL FOR A CALL BACK °Letter sent °

## 2022-03-15 ENCOUNTER — Other Ambulatory Visit: Payer: Self-pay | Admitting: Family Medicine

## 2022-03-15 DIAGNOSIS — E119 Type 2 diabetes mellitus without complications: Secondary | ICD-10-CM

## 2022-03-22 ENCOUNTER — Ambulatory Visit: Payer: Self-pay | Admitting: *Deleted

## 2022-03-22 NOTE — Telephone Encounter (Signed)
  Chief Complaint: Has diarrhea and vomiting for one day every month for the last 3 months.   He feels fine after vomiting/having diarrhea.   He has been on metformin for years so doesn't think it's that.  He is going out of town and only has Fridays off of work so scheduled him for April 19, 2022. Symptoms: Intermittent diarrhea, vomiting, and abd ache until after he vomits or has diarrhea then he is fine. Frequency: About one day out of every month Pertinent Negatives: Patient denies dehydration or feeling dizzy.   "I'm eating and drinking like normal". Disposition: '[]'$ ED /'[]'$ Urgent Care (no appt availability in office) / '[x]'$ Appointment(In office/virtual)/ '[]'$  [facility]/ '[]'$ Home Care/ '[]'$ Refused Recommended Disposition /'[]'$ [facility]/ '[]'$  Follow-up with PCP Additional Notes: S/S gone over to call us back.

## 2022-03-22 NOTE — Telephone Encounter (Signed)
Message from Erick Blinks sent at 03/22/2022 12:14 PM EDT  Summary: Diarrhea, possible med response   Pt thinks he may be having a reaction to his metformin, he says he has an upset stomach with diarrhea occasionally. Random days of sickness he says, very unusual for him.   Best contact: [phone]           Call History   Type Contact Phone/Fax User  03/22/2022 12:14 PM EDT Phone (Incoming) Adam, Wolfe (Self) [phone] Lemmie Evens) Erick Blinks   Reason for Disposition  [1] MILD diarrhea (e.g., 1-3 or more stools than normal in past 24 hours) without known cause AND [2] present >  7 days  Answer Assessment - Initial Assessment Questions 1. DIARRHEA SEVERITY: "How bad is the diarrhea?" "How many more stools have you had in the past 24 hours than normal?"    - NO DIARRHEA (SCALE 0)   - MILD (SCALE 1-3): Few loose or mushy BMs; increase of 1-3 stools over normal daily number of stools; mild increase in ostomy output.   -  MODERATE (SCALE 4-7): Increase of 4-6 stools daily over normal; moderate increase in ostomy output. * SEVERE (SCALE 8-10; OR 'WORST POSSIBLE'): Increase of 7 or more stools daily over normal; moderate increase in ostomy output; incontinence.     Started in April.  Happens one day a month.   It doesn't last long.   It happens on the weekends having diarrhea.   He upped my Ozempic on my last visit.  I'm on Metformin but I've been on it for years.   2. ONSET: "When did the diarrhea begin?"      Last night I had diarrhea and sour burps and feeling quesy for several hours then I vomited.   Then 3:30 AM I woke up with diarrhea but now I'm fine.     My diet has not changed.    3. BM CONSISTENCY: "How loose or watery is the diarrhea?"      Has a substance to it. 4. VOMITING: "Are you also vomiting?" If Yes, ask: "How many times in the past 24 hours?"      Vomiting yes   Happened 3 times. 5. ABDOMINAL PAIN: "Are you having any abdominal pain?" If Yes, ask: "What does  it feel like?" (e.g., crampy, dull, intermittent, constant)      An abd ache until I vomit or have diarrhea then it goes away. 6. ABDOMINAL PAIN SEVERITY: If present, ask: "How bad is the pain?"  (e.g., Scale 1-10; mild, moderate, or severe)   - MILD (1-3): doesn't interfere with normal activities, abdomen soft and not tender to touch    - MODERATE (4-7): interferes with normal activities or awakens from sleep, abdomen tender to touch    - SEVERE (8-10): excruciating pain, doubled over, unable to do any normal activities        7. ORAL INTAKE: If vomiting, "Have you been able to drink liquids?" "How much liquids have you had in the past 24 hours?"     I'm eating and drinking fine 8. HYDRATION: "Any signs of dehydration?" (e.g., dry mouth [not just dry lips], too weak to stand, dizziness, new weight loss) "When did you last urinate?"      9. EXPOSURE: "Have you traveled to a foreign country recently?" "Have you been exposed to anyone with diarrhea?" "Could you have eaten any food that was spoiled?"      10. ANTIBIOTIC USE: "Are you taking antibiotics now or  have you taken antibiotics in the past 2 months?"       No 11. OTHER SYMPTOMS: "Do you have any other symptoms?" (e.g., fever, blood in stool)       Blood 12. PREGNANCY: "Is there any chance you are pregnant?" "When was your last menstrual period?"       N/A  Protocols used: [facility]

## 2022-04-19 ENCOUNTER — Encounter: Payer: Self-pay | Admitting: Physician Assistant

## 2022-04-19 ENCOUNTER — Ambulatory Visit: Payer: BC Managed Care – PPO | Admitting: Physician Assistant

## 2022-04-19 DIAGNOSIS — R1110 Vomiting, unspecified: Secondary | ICD-10-CM

## 2022-04-19 DIAGNOSIS — E119 Type 2 diabetes mellitus without complications: Secondary | ICD-10-CM | POA: Diagnosis not present

## 2022-04-19 DIAGNOSIS — R197 Diarrhea, unspecified: Secondary | ICD-10-CM

## 2022-04-19 LAB — GLUCOSE, POCT (MANUAL RESULT ENTRY): POC Glucose: 247 mg/dl — AB (ref 70–99)

## 2022-04-19 NOTE — Progress Notes (Signed)
Established patient visit   Patient: Adam Wolfe   DOB: 03/24/1966   55 y.o. Male  MRN: [id number] Visit Date: 04/19/2022  Today's healthcare provider: Mardene Speak, PA-C   Chief Complaint  Patient presents with   Diarrhea  CC: diarrhea and vomiting  Subjective    Patient is a 56 yr old male presenting for diarrhea with stomach upset and vomiting.  States this occurs around every 12-13 days every month for the last 3 months.  Episode can last all day. States he feels fine after vomiting / having diarrhea.  Patient is eating and drinking normally.  Denies dehydration or feeling dizzy.  Reports he thinks it may have something to do with taking Metformin or the recent increase of Ozempic.   States symptoms are very unusual for him.   Reports vomiting up to 5-6 times, having diarrhea up to 5-6 loose BMs States having episodes of vomiting or diarrhea  a week after ozempic inejection.  Medications: Outpatient Medications Prior to Visit  Medication Sig   allopurinol (ZYLOPRIM) 300 MG tablet TAKE 1 TABLET BY MOUTH EVERY DAY   gabapentin (NEURONTIN) 300 MG capsule TAKE 1 CAPSULE BY MOUTH EVERYDAY AT BEDTIME   glucose blood test strip Use to check sugar daily for type 2 diabetes E11.9   Insulin Pen Needle (NOVOFINE) 32G X 6 MM MISC Use to check blood sugar daily for type 2 diabetes.   metFORMIN (GLUCOPHAGE-XR) 500 MG 24 hr tablet TAKE 2 TABLETS (1,000 MG TOTAL) BY MOUTH 2 (TWO) TIMES DAILY BEFORE A MEAL.   ONETOUCH DELICA LANCETS FINE MISC Use to check blood sugar daily   OZEMPIC, 2 MG/DOSE, 8 MG/3ML SOPN INJECT 2 MG AS DIRECTED ONCE A WEEK   rosuvastatin (CRESTOR) 10 MG tablet TAKE 1 TABLET BY MOUTH EVERY DAY   valsartan (DIOVAN) 80 MG tablet TAKE 1 TABLET BY MOUTH EVERY DAY   vitamin B-12 (CYANOCOBALAMIN) 1000 MCG tablet Take 1,000 mcg by mouth daily.   fluticasone (FLONASE) 50 MCG/ACT nasal spray Place 2 sprays into both nostrils daily. (Patient not taking: Reported on 04/19/2022)    No facility-administered medications prior to visit.    Review of Systems  Genitourinary:  Negative for difficulty urinating, frequency and urgency.  All other systems reviewed and are negative.  See HPI     Objective    BP 113/73 (BP Location: Right Arm, Patient Position: Sitting, Cuff Size: Large)   Pulse 87   Temp 97.6 F (36.4 C) (Oral)   Resp 16   Ht '5\' 8"'$  (1.727 m)   Wt (!) 304 lb 11.2 oz (138.2 kg)   SpO2 97%   BMI 46.33 kg/m    Physical Exam Vitals reviewed.  Constitutional:      Appearance: Normal appearance. He is obese.  HENT:     Head: Normocephalic and atraumatic.  Cardiovascular:     Rate and Rhythm: Normal rate.  Pulmonary:     Effort: Pulmonary effort is normal.  Neurological:     Mental Status: He is alert and oriented to person, place, and time. Mental status is at baseline.  Psychiatric:        Behavior: Behavior normal.        Thought Content: Thought content normal.        Judgment: Judgment normal.     No results found for any visits on 04/19/22.  Assessment & Plan     1. Morbid obesity (HCC) BMI 46.33  Pt was advised to adhere  to low-carb, low-fat, low-calorie diet and regular exercise. He has been working 10 hours shifts and it has been a challenge to exercise regularly.  2. Diabetes mellitus without complication (HCC)/ Chronic. Stable. Last A1C was 6.7 from 2 mo ago Continue his current medication regimen except see below. Advised to measure his blood sugar daily and bring his record to the next appt  3. Diarrhea and vomiting Intermittent episodes Q 2 weeks Started when a dose of ozempic was increased to 2 mg Recommended to skip next dose of ozempic  If symptoms improve, return to the ozempic '1mg'$  weekly.  Fu with Dr. Caryn Section in 2 weeks   I discussed the assessment and treatment plan with the patient and Patient's PCP. The patient was provided an opportunity to ask questions and all were answered. The patient agreed with  the plan and demonstrated an understanding of the instructions.   The patient was advised to call back or seek an in-person evaluation if the symptoms worsen or if the condition fails to improve as anticipated. Portions of this note were created using dictation software and may contain typographical errors.   Mardene Speak, PA-C  [facility] [phone] (phone) [phone] (fax)  [place]

## 2022-04-19 NOTE — Addendum Note (Signed)
Addended by: Alanson Puls on: 04/19/2022 11:12 AM   Modules accepted: Orders

## 2022-05-02 NOTE — Progress Notes (Signed)
Established patient visit   Patient: Adam Wolfe   DOB: 1966-02-29   56 y.o. Male  MRN: [id number] Visit Date: 05/03/2022  Today's healthcare provider: Mardene Speak, PA-C   Chief Complaint  Patient presents with   Diarrhea   Emesis   Subjective    Follow up for vomiting / diarrhea  The patient was last seen for this 2 weeks ago. Changes made at last visit include: Recommended to skip next dose of ozempic. Reports he did not resume Ozempic.  He reports excellent compliance with treatment. He feels that condition is Improved. He is not having side effects.   ----------------------------------------------------------------------------------------- Patient also advised to measure blood sugar daily and bring record to appt. Outside BS: 113- 130  Medications: Outpatient Medications Prior to Visit  Medication Sig   allopurinol (ZYLOPRIM) 300 MG tablet TAKE 1 TABLET BY MOUTH EVERY DAY   fluticasone (FLONASE) 50 MCG/ACT nasal spray Place 2 sprays into both nostrils daily.   gabapentin (NEURONTIN) 300 MG capsule TAKE 1 CAPSULE BY MOUTH EVERYDAY AT BEDTIME   glucose blood test strip Use to check sugar daily for type 2 diabetes E11.9   Insulin Pen Needle (NOVOFINE) 32G X 6 MM MISC Use to check blood sugar daily for type 2 diabetes.   metFORMIN (GLUCOPHAGE-XR) 500 MG 24 hr tablet TAKE 2 TABLETS (1,000 MG TOTAL) BY MOUTH 2 (TWO) TIMES DAILY BEFORE A MEAL.   ONETOUCH DELICA LANCETS FINE MISC Use to check blood sugar daily   rosuvastatin (CRESTOR) 10 MG tablet TAKE 1 TABLET BY MOUTH EVERY DAY   valsartan (DIOVAN) 80 MG tablet TAKE 1 TABLET BY MOUTH EVERY DAY   vitamin B-12 (CYANOCOBALAMIN) 1000 MCG tablet Take 1,000 mcg by mouth daily.   OZEMPIC, 2 MG/DOSE, 8 MG/3ML SOPN INJECT 2 MG AS DIRECTED ONCE A WEEK (Patient not taking: Reported on 05/03/2022)   No facility-administered medications prior to visit.    Review of Systems  Constitutional: Negative.   Respiratory: Negative.     Cardiovascular: Negative.   Gastrointestinal: Negative.   Except see HPI     Objective    BP 115/80 (BP Location: Right Arm, Patient Position: Sitting, Cuff Size: Large)   Pulse 70   Temp 98.6 F (37 C) (Oral)   Resp 16   Wt (!) 307 lb (139.3 kg)   SpO2 100%   BMI 46.68 kg/m    Physical Exam Vitals reviewed.  Constitutional:      General: He is not in acute distress.    Appearance: Normal appearance. He is not diaphoretic.  HENT:     Head: Normocephalic and atraumatic.  Eyes:     General: No scleral icterus.    Conjunctiva/sclera: Conjunctivae normal.  Cardiovascular:     Rate and Rhythm: Normal rate and regular rhythm.     Pulses: Normal pulses.     Heart sounds: Normal heart sounds. No murmur heard. Pulmonary:     Effort: Pulmonary effort is normal. No respiratory distress.     Breath sounds: Normal breath sounds. No wheezing or rhonchi.  Musculoskeletal:     Cervical back: Neck supple.     Right lower leg: No edema.     Left lower leg: No edema.  Lymphadenopathy:     Cervical: No cervical adenopathy.  Skin:    General: Skin is warm and dry.     Findings: No rash.  Neurological:     Mental Status: He is alert and oriented to person, place, and time.  Mental status is at baseline.  Psychiatric:        Mood and Affect: Mood normal.        Behavior: Behavior normal.       No results found for any visits on 05/03/22.  Assessment & Plan     1. Morbid obesity (HCC) BMI 46.68 - Semaglutide, 1 MG/DOSE, (OZEMPIC, 1 MG/DOSE,) 2 MG/1.5ML SOPN; Inject 2 mg into the skin once a week.  Dispense: 1.5 mL; Refill: 3 Lifestyle modification are encouraged.  2. Diabetes mellitus without complication ([address]) BS at home: 113-130 Chronic and stable - Semaglutide, 1 MG/DOSE, (OZEMPIC, 1 MG/DOSE,) 2 MG/1.5ML SOPN; Inject 2 mg into the skin once a week.  Dispense: 1.5 mL; Refill: 3 - Hemoglobin A1c  3. Essential hypertension BP 115/80 Chronic and stable Continue  current med regimen Continue low-salt diet and regular exercise?  4. Vomiting and diarrhea Most likely due to increased dose of ozempic/'2mg'$  Resolved Pt will restart his ozempic 1 mg weekly   FU with Dr. Caryn Section  The patient was advised to call back or seek an in-person evaluation if the symptoms worsen or if the condition fails to improve as anticipated.  I discussed the assessment and treatment plan with the patient. The patient was provided an opportunity to ask questions and all were answered. The patient agreed with the plan and demonstrated an understanding of the instructions.  The entirety of the information documented in the History of Present Illness, Review of Systems and Physical Exam were personally obtained by me. Portions of this information were initially documented by the CMA and reviewed by me for thoroughness and accuracy.  Portions of this note were created using dictation software and may contain typographical errors.  Adam Speak, PA-C  [facility] [phone] (phone) [phone] (fax)  [place]

## 2022-05-03 ENCOUNTER — Ambulatory Visit: Payer: BC Managed Care – PPO | Admitting: Physician Assistant

## 2022-05-03 ENCOUNTER — Encounter: Payer: Self-pay | Admitting: Physician Assistant

## 2022-05-03 DIAGNOSIS — I10 Essential (primary) hypertension: Secondary | ICD-10-CM | POA: Diagnosis not present

## 2022-05-03 DIAGNOSIS — E119 Type 2 diabetes mellitus without complications: Secondary | ICD-10-CM | POA: Diagnosis not present

## 2022-05-03 DIAGNOSIS — R1110 Vomiting, unspecified: Secondary | ICD-10-CM | POA: Diagnosis not present

## 2022-05-03 DIAGNOSIS — R197 Diarrhea, unspecified: Secondary | ICD-10-CM

## 2022-05-03 MED ORDER — SEMAGLUTIDE (1 MG/DOSE) 4 MG/3ML ~~LOC~~ SOPN: 1.0000 mg | PEN_INJECTOR | SUBCUTANEOUS | 2 refills | Status: DC

## 2022-05-03 MED ORDER — OZEMPIC (1 MG/DOSE) 2 MG/1.5ML ~~LOC~~ SOPN
2.0000 mg | PEN_INJECTOR | SUBCUTANEOUS | 3 refills | Status: DC
Start: 2022-05-03 — End: 2022-05-03

## 2022-05-04 LAB — HEMOGLOBIN A1C
Est. average glucose Bld gHb Est-mCnc: 140 mg/dL
Hgb A1c MFr Bld: 6.5 % — ABNORMAL HIGH (ref 4.8–5.6)

## 2022-05-06 NOTE — Progress Notes (Signed)
Hello Adam Wolfe ,   Your A1C results are back, 6.5 No changes need to be made to medications Any questions please reach out to the office or message me on MyChart!  Warm regards, Mardene Speak, PA-C

## 2022-06-13 NOTE — Progress Notes (Unsigned)
I,Jana Robinson,acting as a scribe for Lelon Huh, MD.,have documented all relevant documentation on the behalf of Levone Otten, MD,as directed by  Lelon Huh, MD while in the presence of Lelon Huh, MD.   Established patient visit   Patient: Adam Wolfe   DOB: [birth year]   56 y.o. Male  MRN: [id number] Visit Date: 06/14/2022  Today's healthcare provider: Lelon Huh, MD   Chief Complaint  Patient presents with   Diabetes   Subjective    Diabetes Mellitus Type II, Follow-up  Lab Results  Component Value Date   HGBA1C 6.5 (H) 05/03/2022   HGBA1C 6.7 (H) 02/08/2022   HGBA1C 6.9 (H) 07/27/2021   Wt Readings from Last 3 Encounters:  06/14/22 (!) 307 lb 4.8 oz (139.4 kg)  05/03/22 (!) 307 lb (139.3 kg)  04/19/22 (!) 304 lb 11.2 oz (138.2 kg)   Last seen for diabetes 5 weeks ago.  Management since then includes: Reduce his Ozempic from 64m to  1 mg weekly after episode of vomiting and diarrhea.  Reports excellent compliance with treatment. He is not having side effects. Nausea and vomiting have completely resolved.  Symptoms: No fatigue No foot ulcerations  No appetite changes No nausea  No paresthesia of the feet  No polydipsia  No polyuria No visual disturbances   No vomiting     Home blood sugar records:  does not check   Episodes of hypoglycemia? No  Current insulin regiment: none  Most Recent Eye Exam: 10/05/21 Current exercise: none Current diet habits: in general, an "unhealthy" diet  Pertinent Labs: Lab Results  Component Value Date   CHOL 94 (L) 07/27/2021   HDL 31 (L) 07/27/2021   LDLCALC 38 07/27/2021   TRIG 147 07/27/2021   CHOLHDL 3.0 07/27/2021   Lab Results  Component Value Date   NA 140 07/27/2021   K 4.7 07/27/2021   CREATININE 1.00 07/27/2021   EGFR 90 07/27/2021   MICROALBUR 20 12/02/2018     --------------------------------------------------------------------------------------------------- He also complains of  having 'high anxiety', feels very anxious especially related to work. He would like a medication to helps with anxiety.   Medications: Outpatient Medications Prior to Visit  Medication Sig   allopurinol (ZYLOPRIM) 300 MG tablet TAKE 1 TABLET BY MOUTH EVERY DAY   fluticasone (FLONASE) 50 MCG/ACT nasal spray Place 2 sprays into both nostrils daily.   gabapentin (NEURONTIN) 300 MG capsule TAKE 1 CAPSULE BY MOUTH EVERYDAY AT BEDTIME   metFORMIN (GLUCOPHAGE-XR) 500 MG 24 hr tablet TAKE 2 TABLETS (1,000 MG TOTAL) BY MOUTH 2 (TWO) TIMES DAILY BEFORE A MEAL.   ONETOUCH DELICA LANCETS FINE MISC Use to check blood sugar daily   rosuvastatin (CRESTOR) 10 MG tablet TAKE 1 TABLET BY MOUTH EVERY DAY   Semaglutide, 1 MG/DOSE, 4 MG/3ML SOPN Inject 1 mg as directed once a week.   valsartan (DIOVAN) 80 MG tablet TAKE 1 TABLET BY MOUTH EVERY DAY   vitamin B-12 (CYANOCOBALAMIN) 1000 MCG tablet Take 1,000 mcg by mouth daily.   glucose blood test strip Use to check sugar daily for type 2 diabetes E11.9   Insulin Pen Needle (NOVOFINE) 32G X 6 MM MISC Use to check blood sugar daily for type 2 diabetes.   No facility-administered medications prior to visit.    Review of Systems  {Labs  Heme  Chem  Endocrine  Serology  Results Review (optional):23779}   Objective    BP 131/89 (BP Location: Left Arm, Patient Position: Sitting, Cuff Size: Large)  Pulse 79   Temp 97.8 F (36.6 C) (Oral)   Resp 16   Wt (!) 307 lb 4.8 oz (139.4 kg)   SpO2 96%   BMI 46.72 kg/m  {Show previous vital signs (optional):23777}  Physical Exam  General appearance: Obese male, cooperative and in no acute distress Head: Normocephalic, without obvious abnormality, atraumatic Respiratory: Respirations even and unlabored, normal respiratory rate Extremities: All extremities are intact.  Skin: Skin color, texture, turgor normal. No rashes seen  Psych: Appropriate mood and affect. Neurologic: Mental status: Alert, oriented to  person, place, and time, thought content appropriate.   Assessment & Plan     1. Type 2 diabetes mellitus with diabetic neuropathy, without long-term current use of insulin ([place]) Doing well with 12m Ozempic,which was cut back from 259min July due to N&V which has since resolved.   2. Vomiting and diarrhea Resolved since reducing dose of Ozempic.   3. Generalized anxiety disorder He would like to start medication. Prescription  venlafaxine XR (EFFEXOR XR) 37.5 MG 24 hr capsule; Take 1 capsule (37.5 mg total) by mouth daily with breakfast.  Dispense: 30 capsule; Refill: 2   Future Appointments  Date Time Provider [place]12/10/2021  8:20 AM FiBirdie SonsMD [facility]         The entirety of the information documented in the History of Present Illness, Review of Systems and Physical Exam were personally obtained by me. Portions of this information were initially documented by the CMA and reviewed by me for thoroughness and accuracy.     DoLelon HuhMD  [phone]) [phone])  [place]

## 2022-06-14 ENCOUNTER — Encounter: Payer: Self-pay | Admitting: Family Medicine

## 2022-06-14 ENCOUNTER — Ambulatory Visit: Payer: BC Managed Care – PPO | Admitting: Family Medicine

## 2022-06-14 VITALS — BP 131/89 | HR 79 | Temp 97.8°F | Resp 16 | Wt 307.3 lb

## 2022-06-14 DIAGNOSIS — E1140 Type 2 diabetes mellitus with diabetic neuropathy, unspecified: Secondary | ICD-10-CM | POA: Diagnosis not present

## 2022-06-14 DIAGNOSIS — F411 Generalized anxiety disorder: Secondary | ICD-10-CM

## 2022-06-14 DIAGNOSIS — R1110 Vomiting, unspecified: Secondary | ICD-10-CM

## 2022-06-14 DIAGNOSIS — R197 Diarrhea, unspecified: Secondary | ICD-10-CM | POA: Diagnosis not present

## 2022-06-14 MED ORDER — VENLAFAXINE HCL ER 37.5 MG PO CP24: 37.5000 mg | ORAL_CAPSULE | Freq: Every day | ORAL | 2 refills | Status: DC

## 2022-06-14 NOTE — Patient Instructions (Addendum)
Please review the attached list of medications and notify my office if there are any errors.   I recommend getting the updated Covid vaccine at your local pharmacy when they come out this month  Start venlafaxine by taking one capsule every morning for the first week, then increase to 2 capsules every morning

## 2022-06-29 ENCOUNTER — Other Ambulatory Visit: Payer: Self-pay | Admitting: Family Medicine

## 2022-06-29 DIAGNOSIS — I10 Essential (primary) hypertension: Secondary | ICD-10-CM

## 2022-07-07 ENCOUNTER — Other Ambulatory Visit: Payer: Self-pay | Admitting: Family Medicine

## 2022-07-07 DIAGNOSIS — F411 Generalized anxiety disorder: Secondary | ICD-10-CM

## 2022-08-24 ENCOUNTER — Other Ambulatory Visit: Payer: Self-pay | Admitting: Family Medicine

## 2022-09-11 NOTE — Progress Notes (Signed)
I,Tiffany J Bragg,acting as a scribe for Lelon Huh, MD.,have documented all relevant documentation on the behalf of Lelon Huh, MD,as directed by  Lelon Huh, MD while in the presence of Lelon Huh, MD.   Established patient visit   Patient: Adam Wolfe   DOB: 14-Apr-1966   56 y.o. Male  MRN: [id number] Visit Date: 09/13/2022  Today's healthcare provider: Lelon Huh, MD   Chief Complaint  Patient presents with   Anxiety   Diabetes   Subjective    HPI  Diabetes Mellitus Type II, Follow-up  Lab Results  Component Value Date   HGBA1C 7.0 (A) 09/13/2022   HGBA1C 6.5 (H) 05/03/2022   HGBA1C 6.7 (H) 02/08/2022   Wt Readings from Last 3 Encounters:  09/13/22 (!) 303 lb (137.4 kg)  06/14/22 (!) 307 lb 4.8 oz (139.4 kg)  05/03/22 (!) 307 lb (139.3 kg)   Last seen for diabetes 3 months ago.  Management since then includes continue medications. He reports excellent compliance with treatment. He is not having side effects.  Symptoms: No fatigue No foot ulcerations  No appetite changes No nausea  Yes paresthesia of the feet  No polydipsia  No polyuria No visual disturbances   No vomiting     Home blood sugar records:  not checked regularly  Episodes of hypoglycemia? No  Most Recent Eye Exam: is due   Pertinent Labs: Lab Results  Component Value Date   CHOL 94 (L) 07/27/2021   HDL 31 (L) 07/27/2021   LDLCALC 38 07/27/2021   TRIG 147 07/27/2021   CHOLHDL 3.0 07/27/2021   Lab Results  Component Value Date   NA 140 07/27/2021   K 4.7 07/27/2021   CREATININE 1.00 07/27/2021   EGFR 90 07/27/2021   MICROALBUR 20 12/02/2018     ---------------------------------------------------------------------------------------------------  Anxiety, Follow-up  He was last seen for anxiety 2 months ago. Changes made at last visit include; started venlafaxine XR 37.5 mg .   He reports excellent compliance with treatment. He reports excellent tolerance of  treatment. He is not having side effects.   He feels his anxiety is moderate and Improved since last visit.  Symptoms: No chest pain No difficulty concentrating  No dizziness No fatigue  No feelings of losing control No insomnia  No irritable No palpitations  No panic attacks No racing thoughts  No shortness of breath No sweating  No tremors/shakes    GAD-7 Results    09/13/2022    8:34 AM  GAD-7 Generalized Anxiety Disorder Screening Tool  1. Feeling Nervous, Anxious, or on Edge 1  2. Not Being Able to Stop or Control Worrying 3  3. Worrying Too Much About Different Things 3  4. Trouble Relaxing 0  5. Being So Restless it's Hard To Sit Still 0  6. Becoming Easily Annoyed or Irritable 0  7. Feeling Afraid As If Something Awful Might Happen 0  Total GAD-7 Score 7  Difficulty At Work, Home, or Getting  Along With Others? Not difficult at all    PHQ-9 Scores    09/13/2022    8:35 AM 06/14/2022    9:06 AM 02/08/2022    8:53 AM  PHQ9 SCORE ONLY  PHQ-9 Total Score _0 ---------------------------------------------------------------------------------------------------   Medications: Outpatient Medications Prior to Visit  Medication Sig   allopurinol (ZYLOPRIM) 300 MG tablet TAKE 1 TABLET BY MOUTH EVERY DAY   fluticasone (FLONASE) 50 MCG/ACT nasal spray Place 2 sprays into both nostrils daily.  gabapentin (NEURONTIN) 300 MG capsule TAKE 1 CAPSULE BY MOUTH EVERYDAY AT BEDTIME   glucose blood test strip Use to check sugar daily for type 2 diabetes E11.9   metFORMIN (GLUCOPHAGE-XR) 500 MG 24 hr tablet TAKE 2 TABLETS (1,000 MG TOTAL) BY MOUTH 2 (TWO) TIMES DAILY BEFORE A MEAL.   ONETOUCH DELICA LANCETS FINE MISC Use to check blood sugar daily   rosuvastatin (CRESTOR) 10 MG tablet TAKE 1 TABLET BY MOUTH EVERY DAY   Semaglutide, 1 MG/DOSE, 4 MG/3ML SOPN Inject 1 mg as directed once a week.   valsartan (DIOVAN) 80 MG tablet TAKE 1 TABLET BY MOUTH EVERY DAY   venlafaxine XR  (EFFEXOR-XR) 37.5 MG 24 hr capsule TAKE 1 CAPSULE BY MOUTH DAILY WITH BREAKFAST.   vitamin B-12 (CYANOCOBALAMIN) 1000 MCG tablet Take 1,000 mcg by mouth daily.   Insulin Pen Needle (NOVOFINE) 32G X 6 MM MISC Use to check blood sugar daily for type 2 diabetes. (Patient not taking: Reported on 09/13/2022)   No facility-administered medications prior to visit.    Review of Systems  Constitutional:  Negative for appetite change, chills and fever.  Respiratory:  Negative for chest tightness, shortness of breath and wheezing.   Cardiovascular:  Negative for chest pain and palpitations.  Gastrointestinal:  Negative for abdominal pain, nausea and vomiting.       Objective    BP 135/84 (BP Location: Right Arm, Patient Position: Sitting, Cuff Size: Large)   Pulse 85   Resp 16   Ht _0  (1.702 m)   Wt (!) 303 lb (137.4 kg)   SpO2 97%   BMI 47.46 kg/m    Physical Exam  General appearance: Obese male, cooperative and in no acute distress Head: Normocephalic, without obvious abnormality, atraumatic Respiratory: Respirations even and unlabored, normal respiratory rate Extremities: All extremities are intact.  Skin: Skin color, texture, turgor normal. No rashes seen  Psych: Appropriate mood and affect. Neurologic: Mental status: Alert, oriented to person, place, and time, thought content appropriate.   Results for orders placed or performed in visit on 09/13/22  POCT glycosylated hemoglobin (Hb A1C)  Result Value Ref Range   Hemoglobin A1C 7.0 (A) 4.0 - 5.6 %   Est. average glucose Bld gHb Est-mCnc 154     Assessment & Plan     1. Generalized anxiety disorder He feels starting dose of venlafaxine has been helpful, but still having significant stress and anxiety. He is to increase to 2 x 37.51m for the next week and will change to 736mcapsules before next refill if tolerating.   2. Type 2 diabetes mellitus with diabetic neuropathy, without long-term current use of insulin  ([place], did not tolerate the 75m9mzempic, but having no problems with 1mg675mCBC - Comprehensive metabolic panel - Lipid panel - TSH  Follow up in 4 months.   3. Prostate cancer screening  - PSA Total (Reflex To Free) ([facility] only)  4. History of adenomatous polyp of colon   5. Family history of colon cancer Counseled on his high risk of developing invasive colon cancer and importance of follow up colonoscopy. He has not yet scheduled due to cost but advised that cost of surveillance is much less than cost of treating invasive cancer.   6. Essential hypertension Well controlled.  Continue current medications.        The entirety of the information documented in the History of Present Illness, Review of Systems and Physical Exam were personally obtained by  me. Portions of this information were initially documented by the CMA and reviewed by me for thoroughness and accuracy.     Lelon Huh, MD  [facility] [phone] (phone) [phone] (fax)  [place]

## 2022-09-13 ENCOUNTER — Ambulatory Visit: Payer: BC Managed Care – PPO | Admitting: Family Medicine

## 2022-09-13 ENCOUNTER — Encounter: Payer: Self-pay | Admitting: Family Medicine

## 2022-09-13 VITALS — BP 135/84 | HR 85 | Resp 16 | Ht 67.0 in | Wt 303.0 lb

## 2022-09-13 DIAGNOSIS — Z125 Encounter for screening for malignant neoplasm of prostate: Secondary | ICD-10-CM

## 2022-09-13 DIAGNOSIS — F411 Generalized anxiety disorder: Secondary | ICD-10-CM | POA: Diagnosis not present

## 2022-09-13 DIAGNOSIS — Z86010 Personal history of colonic polyps: Secondary | ICD-10-CM

## 2022-09-13 DIAGNOSIS — Z800 Family history of malignant neoplasm of digestive organs: Secondary | ICD-10-CM | POA: Insufficient documentation

## 2022-09-13 DIAGNOSIS — E1140 Type 2 diabetes mellitus with diabetic neuropathy, unspecified: Secondary | ICD-10-CM

## 2022-09-13 DIAGNOSIS — I10 Essential (primary) hypertension: Secondary | ICD-10-CM

## 2022-09-13 LAB — POCT GLYCOSYLATED HEMOGLOBIN (HGB A1C)
Est. average glucose Bld gHb Est-mCnc: 154
Hemoglobin A1C: 7 % — AB (ref 4.0–5.6)

## 2022-09-13 MED ORDER — VENLAFAXINE HCL ER 37.5 MG PO CP24: 75.0000 mg | ORAL_CAPSULE | Freq: Every day | ORAL | Status: DC

## 2022-09-13 NOTE — Patient Instructions (Addendum)
You are at high risk for developing colon cancer due to previous polyps. Please call [facility] at [phone] to schedule a follow up colonoscopy with Dr. Ashok Norris can increase the venlafaxine to 2 x 37.'5mg'$  tablet for the next week. If I don't hear otherwise, I'll send in a prescription for '75mg'$  capsule at the end of next week.

## 2022-09-14 LAB — COMPREHENSIVE METABOLIC PANEL
ALT: 40 IU/L (ref 0–44)
AST: 30 IU/L (ref 0–40)
Albumin/Globulin Ratio: 1.9 (ref 1.2–2.2)
Albumin: 4.3 g/dL (ref 3.8–4.9)
Alkaline Phosphatase: 76 IU/L (ref 44–121)
BUN/Creatinine Ratio: 17 (ref 9–20)
BUN: 17 mg/dL (ref 6–24)
Bilirubin Total: 0.3 mg/dL (ref 0.0–1.2)
CO2: 23 mmol/L (ref 20–29)
Calcium: 9.5 mg/dL (ref 8.7–10.2)
Chloride: 102 mmol/L (ref 96–106)
Creatinine, Ser: 1.01 mg/dL (ref 0.76–1.27)
Globulin, Total: 2.3 g/dL (ref 1.5–4.5)
Glucose: 181 mg/dL — ABNORMAL HIGH (ref 70–99)
Potassium: 4.7 mmol/L (ref 3.5–5.2)
Sodium: 140 mmol/L (ref 134–144)
Total Protein: 6.6 g/dL (ref 6.0–8.5)
eGFR: 88 mL/min/{1.73_m2} (ref >59)

## 2022-09-14 LAB — CBC
Hematocrit: 44.5 % (ref 37.5–51.0)
Hemoglobin: 15.4 g/dL (ref 13.0–17.7)
MCH: 32.3 pg (ref 26.6–33.0)
MCHC: 34.6 g/dL (ref 31.5–35.7)
MCV: 93 fL (ref 79–97)
Platelets: 224 10*3/uL (ref 150–450)
RBC: 4.77 x10E6/uL (ref 4.14–5.80)
RDW: 13.2 % (ref 11.6–15.4)
WBC: 8.2 10*3/uL (ref 3.4–10.8)

## 2022-09-14 LAB — LIPID PANEL
Chol/HDL Ratio: 3.7 ratio (ref 0.0–5.0)
Cholesterol, Total: 106 mg/dL (ref 100–199)
HDL: 29 mg/dL — ABNORMAL LOW (ref >39)
LDL Chol Calc (NIH): 41 mg/dL (ref 0–99)
Triglycerides: 226 mg/dL — ABNORMAL HIGH (ref 0–149)
VLDL Cholesterol Cal: 36 mg/dL (ref 5–40)

## 2022-09-14 LAB — PSA TOTAL (REFLEX TO FREE): Prostate Specific Ag, Serum: 1.3 ng/mL (ref 0.0–4.0)

## 2022-09-14 LAB — TSH: TSH: 1.88 u[IU]/mL (ref 0.450–4.500)

## 2022-10-11 ENCOUNTER — Encounter: Payer: Self-pay | Admitting: Family Medicine

## 2022-11-12 ENCOUNTER — Telehealth: Payer: Self-pay | Admitting: Family Medicine

## 2022-11-12 DIAGNOSIS — F411 Generalized anxiety disorder: Secondary | ICD-10-CM

## 2022-11-12 NOTE — Telephone Encounter (Signed)
Patients wife Nevin Bloodgood states that her insurance company sent a message stating that they will not be covering ozempic in April. She was waning to know if the patients PCP has heard of this and if so if the patients ozempic will still be covered since he use the medication for his diabetes.    Please advise.

## 2022-11-13 ENCOUNTER — Other Ambulatory Visit: Payer: Self-pay

## 2022-11-13 MED ORDER — VENLAFAXINE HCL ER 75 MG PO CP24
75.0000 mg | ORAL_CAPSULE | Freq: Every day | ORAL | 1 refills | Status: DC
  Filled 2022-11-13 (×2): qty 90, 90d supply, fill #0

## 2022-11-14 ENCOUNTER — Other Ambulatory Visit: Payer: Self-pay

## 2022-11-14 MED ORDER — VENLAFAXINE HCL ER 75 MG PO CP24: 75.0000 mg | ORAL_CAPSULE | Freq: Every day | ORAL | 1 refills | Status: DC

## 2022-11-14 NOTE — Addendum Note (Signed)
Addended by: Barnie Mort on: 11/14/2022 01:41 PM   Modules accepted: Orders

## 2022-11-14 NOTE — Telephone Encounter (Signed)
[name]  [address] [address] Phone: [phone]: [phone]  Pt needs this Rx sent to the pharmacy above, he also needs the Rx written for twice a day. He says his PCP has increased his dosage. He says he is leaving town this weekend and wants to pick them up today.

## 2022-11-14 NOTE — Telephone Encounter (Signed)
New rx sent with dosage change to 75 mg as advised during patient LOV  Patient advised he is to go back to one tablet daily due to the dosage already being increased. Verbalized understanding [facility] [place]. Order sent. [facility] notified to cancel order

## 2022-12-11 NOTE — Progress Notes (Signed)
Adam Wolfe,acting as a scribe for Adam Huh, MD.,have documented all relevant documentation on the behalf of Adam Tisby, MD,as directed by  Adam Huh, MD while in the presence of Adam Huh, MD.     Established patient visit   Patient: Adam Wolfe   DOB: December 18, 1965   57 y.o. Male  MRN: [id number] Visit Date: 12/13/2022  Today's healthcare provider: Lelon Huh, MD   Chief Complaint  Patient presents with   Diabetes   Hypertension   Subjective    HPI  Diabetes Mellitus Type II, Follow-up  Lab Results  Component Value Date   HGBA1C 7.0 (A) 09/13/2022   HGBA1C 6.5 (H) 05/03/2022   HGBA1C 6.7 (H) 02/08/2022   Wt Readings from Last 3 Encounters:  12/13/22 (!) 302 lb (137 kg)  09/13/22 (!) 303 lb (137.4 kg)  06/14/22 (!) 307 lb 4.8 oz (139.4 kg)   Last seen for diabetes 2 months ago.  Management since then includes no changes. He reports good compliance with treatment. He is not having side effects.  Symptoms: No fatigue No foot ulcerations  No appetite changes No nausea  Yes paresthesia of the feet  No polydipsia  No polyuria No visual disturbances   No vomiting     Home blood sugar records:  not being checked  Episodes of hypoglycemia? No    Current insulin regiment: NA Most Recent Eye Exam: Patient is due Current exercise: walking Current diet habits: nothing specific  Pertinent Labs: Lab Results  Component Value Date   CHOL 106 09/13/2022   HDL 29 (L) 09/13/2022   LDLCALC 41 09/13/2022   TRIG 226 (H) 09/13/2022   CHOLHDL 3.7 09/13/2022   Lab Results  Component Value Date   NA 140 09/13/2022   K 4.7 09/13/2022   CREATININE 1.01 09/13/2022   EGFR 88 09/13/2022   MICRALBCREAT n/a 11/05/2017     --------------------------------------------------------------------------------------------------- Anxiety, Follow-up  He was last seen for anxiety 2 months ago. Changes made at last visit include increased Venlafaxine.   He  reports good compliance with treatment. He reports good tolerance of treatment. He is not having side effects.    Symptoms: No chest pain No difficulty concentrating  No dizziness No fatigue  No feelings of losing control No insomnia  No irritable No palpitations  No panic attacks No racing thoughts  No shortness of breath No sweating  No tremors/shakes    GAD-7 Results    09/13/2022    8:34 AM  GAD-7 Generalized Anxiety Disorder Screening Tool  1. Feeling Nervous, Anxious, or on Edge 1  2. Not Being Able to Stop or Control Worrying 3  3. Worrying Too Much About Different Things 3  4. Trouble Relaxing 0  5. Being So Restless it's Hard To Sit Still 0  6. Becoming Easily Annoyed or Irritable 0  7. Feeling Afraid As If Something Awful Might Happen 0  Total GAD-7 Score 7  Difficulty At Work, Home, or Getting  Along With Others? Not difficult at all    PHQ-9 Scores    12/13/2022    8:16 AM 09/13/2022    8:35 AM 06/14/2022    9:06 AM  PHQ9 SCORE ONLY  PHQ-9 Total Score '4 4 3    '$ --------------------------------------------------------------------------------------------------- He also complains of drainage and bleeding from his right ear the last couple of weeks. Is not painful and does not seem to be affecting his hearing.   He also states his CPAP machine is over 67 years old and  having trouble getting parts. He is in need of new machine. He went to [facility] and was advised that he will need to have a new sleep study done.   Medications: Outpatient Medications Prior to Visit  Medication Sig   allopurinol (ZYLOPRIM) 300 MG tablet TAKE 1 TABLET BY MOUTH EVERY DAY   gabapentin (NEURONTIN) 300 MG capsule TAKE 1 CAPSULE BY MOUTH EVERYDAY AT BEDTIME   glucose blood test strip Use to check sugar daily for type 2 diabetes E11.9   Insulin Pen Needle (NOVOFINE) 32G X 6 MM MISC Use to check blood sugar daily for type 2 diabetes.   metFORMIN (GLUCOPHAGE-XR) 500 MG 24 hr tablet TAKE 2  TABLETS (1,000 MG TOTAL) BY MOUTH 2 (TWO) TIMES DAILY BEFORE A MEAL.   ONETOUCH DELICA LANCETS FINE MISC Use to check blood sugar daily   rosuvastatin (CRESTOR) 10 MG tablet TAKE 1 TABLET BY MOUTH EVERY DAY   Semaglutide, 1 MG/DOSE, 4 MG/3ML SOPN Inject 1 mg as directed once a week.   valsartan (DIOVAN) 80 MG tablet TAKE 1 TABLET BY MOUTH EVERY DAY   venlafaxine XR (EFFEXOR-XR) 75 MG 24 hr capsule Take 1 capsule (75 mg total) by mouth daily with breakfast.   vitamin B-12 (CYANOCOBALAMIN) 1000 MCG tablet Take 1,000 mcg by mouth daily.   [DISCONTINUED] fluticasone (FLONASE) 50 MCG/ACT nasal spray Place 2 sprays into both nostrils daily.   No facility-administered medications prior to visit.    Review of Systems  Constitutional:  Negative for appetite change, chills and fever.  Respiratory:  Negative for chest tightness, shortness of breath and wheezing.   Cardiovascular:  Negative for chest pain and palpitations.  Gastrointestinal:  Negative for abdominal pain, nausea and vomiting.       Objective    BP 125/74 (BP Location: Right Arm, Patient Position: Sitting, Cuff Size: Large)   Pulse 84   Temp 98.6 F (37 C) (Oral)   Wt (!) 302 lb (137 kg)   SpO2 99%   BMI 47.30 kg/m    Physical Exam   General Appearance:    Severely obese male, alert, cooperative, in no acute distress  HENT:   Right ear canal irritating with small areas of bleeding with white and green purulent discharge.   Eyes:    PERRL, conjunctiva/corneas clear, EOM's intact       Lungs:     Clear to auscultation bilaterally, respirations unlabored  Heart:    Normal heart rate. Normal rhythm. No murmurs, rubs, or gallops.    Neurologic:   Awake, alert, oriented x 3. No apparent focal neurological           defect.        Results for orders placed or performed in visit on 12/13/22  POCT glycosylated hemoglobin (Hb A1C)  Result Value Ref Range   Hemoglobin A1C 7.0 (A) 4.0 - 5.6 %   Est. average glucose Bld gHb  Est-mCnc 154     Assessment & Plan     1. Type 2 diabetes mellitus with diabetic neuropathy, without long-term current use of insulin ([name]) Fairly well controlled. Did not tolerate '2mg'$  Ozempic in the past, but having no problems with '1mg'$  dose. He is going to work eating less sugar and losing weight. Discussed potentially changing to [facility] if not improving at follow up.   2. Other infective acute otitis externa of right ear Appears to be fungal. - acetic acid 2 % otic solution; Place 4 drops into the right ear every 3 (  three) hours.  Dispense: 15 mL; Refill: 0  Call for ENT referral if not resolved when finished with antibiotic.   3. Obstructive sleep apnea Current CPAP machine is over 70 years old.  He does not want to use [facility] for CPAP supplies. He has been in contact at [facility] center in [place],   - Ambulatory referral to Sleep Studies  4. History of adenomatous polyp of colon  - Ambulatory referral to Gastroenterology  5. Colon cancer screening  - Ambulatory referral to Gastroenterology   He declined recommended flu vaccine.      The entirety of the information documented in the History of Present Illness, Review of Systems and Physical Exam were personally obtained by me. Portions of this information were initially documented by the CMA and reviewed by me for thoroughness and accuracy.     Adam Huh, MD  [facility] [phone] (phone) [phone] (fax)  [place]

## 2022-12-13 ENCOUNTER — Ambulatory Visit: Payer: BC Managed Care – PPO | Admitting: Family Medicine

## 2022-12-13 ENCOUNTER — Telehealth: Payer: Self-pay

## 2022-12-13 VITALS — BP 125/74 | HR 84 | Temp 98.6°F | Wt 302.0 lb

## 2022-12-13 DIAGNOSIS — H60391 Other infective otitis externa, right ear: Secondary | ICD-10-CM

## 2022-12-13 DIAGNOSIS — Z86010 Personal history of colonic polyps: Secondary | ICD-10-CM

## 2022-12-13 DIAGNOSIS — G4733 Obstructive sleep apnea (adult) (pediatric): Secondary | ICD-10-CM | POA: Diagnosis not present

## 2022-12-13 DIAGNOSIS — Z860101 Personal history of adenomatous and serrated colon polyps: Secondary | ICD-10-CM

## 2022-12-13 DIAGNOSIS — E1140 Type 2 diabetes mellitus with diabetic neuropathy, unspecified: Secondary | ICD-10-CM | POA: Diagnosis not present

## 2022-12-13 DIAGNOSIS — Z1211 Encounter for screening for malignant neoplasm of colon: Secondary | ICD-10-CM

## 2022-12-13 LAB — POCT GLYCOSYLATED HEMOGLOBIN (HGB A1C)
Est. average glucose Bld gHb Est-mCnc: 154
Hemoglobin A1C: 7 % — AB (ref 4.0–5.6)

## 2022-12-13 MED ORDER — ACETIC ACID 2 % OT SOLN: 4.0000 [drp] | OTIC | 0 refills | Status: DC

## 2022-12-13 NOTE — Telephone Encounter (Signed)
Patient not ready to schedule his colonoscopy.  He has requested to have a reminder letter mailed to him to schedule at a later time.  Thanks,  [place], [place]

## 2022-12-13 NOTE — Patient Instructions (Addendum)
Please review the attached list of medications and notify my office if there are any errors.   Call [facility] at [phone] to schedule your colonoscopy

## 2023-01-13 ENCOUNTER — Encounter: Payer: Self-pay | Admitting: Family Medicine

## 2023-01-13 ENCOUNTER — Telehealth: Payer: Self-pay

## 2023-01-13 NOTE — Telephone Encounter (Signed)
So you know anything about this.  It looks like it was sent to [facility]    Copied from [place] [id number]. Topic: General - Inquiry >> Jan 13, 2023  3:36 PM Rosanne Ashing P wrote: Reason for CRM: Pt called saying he got the notice about [place] but he thought he was using [facility]  They are the ones who contacted health..  Please advise  [phone]

## 2023-01-13 NOTE — Progress Notes (Signed)
[facility] 01/10/2023 indicated severe sleep apnea. Completed forms for for auto titrate CPAP 4-20cm and sent to medical records to be faxed to [place]

## 2023-01-14 NOTE — Telephone Encounter (Signed)
Order was sent to medical records to fax to [facility] yesterday, but I guess he is wanting to use [facility] now. Please have order and results of sleep study sent to [facility].

## 2023-01-15 NOTE — Telephone Encounter (Signed)
Pt called back, advised pt that the paperwork was faxed over to [place].

## 2023-01-20 NOTE — Telephone Encounter (Signed)
Patient called in states [facility] still doesn't have the fax for a new cpap [place] [phone]

## 2023-01-21 ENCOUNTER — Telehealth: Payer: Self-pay

## 2023-01-21 NOTE — Telephone Encounter (Signed)
Copied from CRM [id number]. Topic: General - Other >> Jan 21, 2023  8:46 AM Patsy Lager T wrote: Reason for CRM: Tashana from [employer] needs an alternate number to fax over a Certificate of Medical Necessity letter for a APAP machine for patient. She has been faxing to [phone] since last Tuesday with no success. Please contact her to advise

## 2023-01-29 ENCOUNTER — Telehealth: Payer: Self-pay | Admitting: Family Medicine

## 2023-01-29 NOTE — Telephone Encounter (Signed)
Following up on message from 01-13-2023 regarding CPAP order to [facility].... did patient ever get CPAP machine. If so he needs follow up office visit in about 4 weeks. If not then do we need to resend order to [facility]?

## 2023-03-02 ENCOUNTER — Other Ambulatory Visit: Payer: Self-pay | Admitting: Family Medicine

## 2023-03-02 DIAGNOSIS — E781 Pure hyperglyceridemia: Secondary | ICD-10-CM

## 2023-03-02 DIAGNOSIS — E119 Type 2 diabetes mellitus without complications: Secondary | ICD-10-CM

## 2023-03-03 ENCOUNTER — Encounter: Payer: Self-pay | Admitting: Family Medicine

## 2023-03-03 ENCOUNTER — Ambulatory Visit (INDEPENDENT_AMBULATORY_CARE_PROVIDER_SITE_OTHER): Payer: BC Managed Care – PPO | Admitting: Family Medicine

## 2023-03-03 VITALS — BP 134/81 | HR 74 | Ht 67.0 in | Wt 314.0 lb

## 2023-03-03 DIAGNOSIS — G4733 Obstructive sleep apnea (adult) (pediatric): Secondary | ICD-10-CM

## 2023-03-03 DIAGNOSIS — I10 Essential (primary) hypertension: Secondary | ICD-10-CM

## 2023-03-03 NOTE — Progress Notes (Signed)
Established patient visit   Patient: Adam Wolfe   DOB: 12-15-1965   57 y.o. Male  MRN: [id number] Visit Date: 03/03/2023  Today's healthcare provider: Mila Merry, MD   Chief Complaint  Patient presents with   Hypertension   Sleep Apnea   Subjective    Hypertension Pertinent negatives include no chest pain, palpitations or shortness of breath.   Follow up since getting new CPAP machine with nasal cushion through [facility]. Has been using new CPAP machine for about a month and states is working very well. Is using every night and stays on all night long. Feels well rested the next morning and throughout the day. Had previously had CPAP machine for nearly 20 years.   Also reports that his anxiety is much better. Has changed back to job he had prior to Covid which is much less stressful. Stopped taking venlafaxine about a month ago and doing well. Job is not as physically active, so he is going to try to get more exercise.   Medications: Outpatient Medications Prior to Visit  Medication Sig   acetic acid 2 % otic solution Place 4 drops into the right ear every 3 (three) hours.   allopurinol (ZYLOPRIM) 300 MG tablet TAKE 1 TABLET BY MOUTH EVERY DAY   gabapentin (NEURONTIN) 300 MG capsule TAKE 1 CAPSULE BY MOUTH EVERYDAY AT BEDTIME   glucose blood test strip Use to check sugar daily for type 2 diabetes E11.9   Insulin Pen Needle (NOVOFINE) 32G X 6 MM MISC Use to check blood sugar daily for type 2 diabetes.   metFORMIN (GLUCOPHAGE-XR) 500 MG 24 hr tablet TAKE 2 TABLETS (1,000 MG TOTAL) BY MOUTH 2 (TWO) TIMES DAILY BEFORE A MEAL.   ONETOUCH DELICA LANCETS FINE MISC Use to check blood sugar daily   rosuvastatin (CRESTOR) 10 MG tablet TAKE 1 TABLET BY MOUTH EVERY DAY   Semaglutide, 1 MG/DOSE, 4 MG/3ML SOPN Inject 1 mg as directed once a week.   valsartan (DIOVAN) 80 MG tablet TAKE 1 TABLET BY MOUTH EVERY DAY   vitamin B-12 (CYANOCOBALAMIN) 1000 MCG tablet Take 1,000 mcg by  mouth daily.   venlafaxine XR (EFFEXOR-XR) 75 MG 24 hr capsule Take 1 capsule (75 mg total) by mouth daily with breakfast. (Patient not taking: Reported on 03/03/2023)   No facility-administered medications prior to visit.    Review of Systems  Constitutional:  Negative for appetite change, chills and fever.  Respiratory:  Negative for chest tightness, shortness of breath and wheezing.   Cardiovascular:  Negative for chest pain and palpitations.  Gastrointestinal:  Negative for abdominal pain, nausea and vomiting.       Objective    BP 134/81 (BP Location: Right Arm, Patient Position: Sitting, Cuff Size: Large)   Pulse 74   Ht 5\' 7"  (1.702 m)   Wt (!) 314 lb (142.4 kg)   SpO2 98%   BMI 49.18 kg/m    Physical Exam  General appearance: Severely obese male, cooperative and in no acute distress Head: Normocephalic, without obvious abnormality, atraumatic Respiratory: Respirations even and unlabored, normal respiratory rate Extremities: All extremities are intact.  Skin: Skin color, texture, turgor normal. No rashes seen  Psych: Appropriate mood and affect. Neurologic: Mental status: Alert, oriented to person, place, and time, thought content appropriate.   Assessment & Plan     1. Obstructive sleep apnea Doing well with new replacement CPAP machine which he is using consisently every night and medically benefiting from its  using.    2. Morbid obesity (HCC) He plans on working on exercise more and eating less to lose weight.   3. Essential hypertension Fairly well controlled on current medication.   Future Appointments  Date Time Provider Department Center  04/25/2023  8:20 AM Sherrie Mustache, Demetrios Isaacs, MD [facility]           Mila Merry, MD  [facility] [phone] (phone) [phone] (fax)  [facility]

## 2023-03-03 NOTE — Addendum Note (Signed)
Addended by: Malva Limes on: 03/03/2023 08:43 AM   Modules accepted: Orders

## 2023-03-04 ENCOUNTER — Other Ambulatory Visit: Payer: Self-pay

## 2023-03-04 MED ORDER — GLUCOSE BLOOD VI STRP: ORAL_STRIP | 12 refills | Status: AC

## 2023-03-04 MED ORDER — ALLOPURINOL 300 MG PO TABS: 300.0000 mg | ORAL_TABLET | Freq: Every day | ORAL | 4 refills | Status: DC

## 2023-03-04 NOTE — Telephone Encounter (Signed)
Requested Prescriptions  Pending Prescriptions Disp Refills   rosuvastatin (CRESTOR) 10 MG tablet [Pharmacy Med Name: ROSUVASTATIN CALCIUM 10 MG TAB] 90 tablet 1    Sig: TAKE 1 TABLET BY MOUTH EVERY DAY     Cardiovascular:  Antilipid - Statins 2 Failed - 03/02/2023  8:45 AM      Failed - Lipid Panel in normal range within the last 12 months    Cholesterol, Total  Date Value Ref Range Status  09/13/2022 106 100 - 199 mg/dL Final   LDL Chol Calc (NIH)  Date Value Ref Range Status  09/13/2022 41 0 - 99 mg/dL Final   HDL  Date Value Ref Range Status  09/13/2022 29 (L) >39 mg/dL Final   Triglycerides  Date Value Ref Range Status  09/13/2022 226 (H) 0 - 149 mg/dL Final         Passed - Cr in normal range and within 360 days    Creat  Date Value Ref Range Status  07/03/2017 1.09 0.60 - 1.35 mg/dL Final   Creatinine, Ser  Date Value Ref Range Status  09/13/2022 1.01 0.76 - 1.27 mg/dL Final   Creatinine, POC  Date Value Ref Range Status  11/05/2017 n/a mg/dL Final         Passed - Patient is not pregnant      Passed - Valid encounter within last 12 months    Recent Outpatient Visits           Yesterday Obstructive sleep apnea   [place] [facility] Malva Limes, MD   2 months ago Type 2 diabetes mellitus with diabetic neuropathy, without long-term current use of insulin (HCC)   [facility] Malva Limes, MD   5 months ago Generalized anxiety disorder   [place] [facility] Malva Limes, MD   8 months ago Type 2 diabetes mellitus with diabetic neuropathy, without long-term current use of insulin (HCC)   [place] [facility] Malva Limes, MD   10 months ago Morbid obesity [facility])   [place] [facility] [place], [place], PA-C       Future Appointments             In 1 month Fisher, Demetrios Isaacs, MD [facility], [facility]              metFORMIN (GLUCOPHAGE-XR) 500 MG 24 hr tablet [Pharmacy Med Name: METFORMIN HCL ER 500 MG TABLET] 360 tablet 0    Sig: TAKE 2 TABLETS (1,000 MG TOTAL) BY MOUTH 2 (TWO) TIMES DAILY BEFORE A MEAL.     Endocrinology:  Diabetes - Biguanides Failed - 03/02/2023  8:45 AM      Failed - B12 Level in normal range and within 720 days    No results found for: "VITAMINB12"       Failed - CBC within normal limits and completed in the last 12 months    WBC  Date Value Ref Range Status  09/13/2022 8.2 3.4 - 10.8 x10E3/uL Final  05/02/2016 8.0 3.8 - 10.6 K/uL Final   RBC  Date Value Ref Range Status  09/13/2022 4.77 4.14 - 5.80 x10E6/uL Final  05/02/2016 4.85 4.40 - 5.90 MIL/uL Final   Hemoglobin  Date Value Ref Range Status  09/13/2022 15.4 13.0 - 17.7 g/dL Final   Hematocrit  Date Value Ref Range Status  09/13/2022 44.5 37.5 - 51.0 % Final   MCHC  Date Value Ref  Range Status  09/13/2022 34.6 31.5 - 35.7 g/dL Final  91/47/8295 62.1 32.0 - 36.0 g/dL Final   [facility]  Date Value Ref Range Status  09/13/2022 32.3 26.6 - 33.0 pg Final  05/02/2016 31.9 26.0 - 34.0 pg Final   MCV  Date Value Ref Range Status  09/13/2022 93 79 - 97 fL Final   No results found for: "PLTCOUNTKUC", "LABPLAT", "POCPLA" RDW  Date Value Ref Range Status  09/13/2022 13.2 11.6 - 15.4 % Final         Passed - Cr in normal range and within 360 days    Creat  Date Value Ref Range Status  07/03/2017 1.09 0.60 - 1.35 mg/dL Final   Creatinine, Ser  Date Value Ref Range Status  09/13/2022 1.01 0.76 - 1.27 mg/dL Final   Creatinine, POC  Date Value Ref Range Status  11/05/2017 n/a mg/dL Final         Passed - HBA1C is between 0 and 7.9 and within 180 days    Hemoglobin A1C  Date Value Ref Range Status  12/13/2022 7.0 (A) 4.0 - 5.6 % Final   Hgb A1c MFr Bld  Date Value Ref Range Status  05/03/2022 6.5 (H) 4.8 - 5.6 % Final    Comment:             Prediabetes: 5.7 - 6.4          Diabetes: >6.4           Glycemic control for adults with diabetes: <7.0          Passed - eGFR in normal range and within 360 days    GFR, Est African American  Date Value Ref Range Status  07/03/2017 92 > OR = 60 mL/min/1.26m2 Final   GFR calc Af Amer  Date Value Ref Range Status  03/21/2020 102 >59 mL/min/1.73 Final    Comment:    **[facility] currently reports eGFR in compliance with the current**   recommendations of the [employer]. [facility] will   update reporting as new guidelines are published from the NKF-ASN   Task force.    GFR, Est Non African American  Date Value Ref Range Status  07/03/2017 79 > OR = 60 mL/min/1.91m2 Final   GFR calc non Af Amer  Date Value Ref Range Status  03/21/2020 88 >59 mL/min/1.73 Final   eGFR  Date Value Ref Range Status  09/13/2022 88 >59 mL/min/1.73 Final         Passed - Valid encounter within last 6 months    Recent Outpatient Visits           Yesterday Obstructive sleep apnea   [facility] Malva Limes, MD   2 months ago Type 2 diabetes mellitus with diabetic neuropathy, without long-term current use of insulin (HCC)   [place] [facility] Malva Limes, MD   5 months ago Generalized anxiety disorder   [place] [facility] Malva Limes, MD   8 months ago Type 2 diabetes mellitus with diabetic neuropathy, without long-term current use of insulin (HCC)   [place] [facility] Malva Limes, MD   10 months ago Morbid obesity [facility])    [facility] [place], [place], PA-C       Future Appointments             In 1 month Fisher, Demetrios Isaacs, MD [facility], [facility]

## 2023-04-01 DIAGNOSIS — G4733 Obstructive sleep apnea (adult) (pediatric): Secondary | ICD-10-CM | POA: Diagnosis not present

## 2023-04-25 ENCOUNTER — Ambulatory Visit: Payer: BC Managed Care – PPO | Admitting: Family Medicine

## 2023-05-01 DIAGNOSIS — G4733 Obstructive sleep apnea (adult) (pediatric): Secondary | ICD-10-CM | POA: Diagnosis not present

## 2023-05-05 ENCOUNTER — Telehealth: Payer: Self-pay | Admitting: Family Medicine

## 2023-05-05 DIAGNOSIS — E119 Type 2 diabetes mellitus without complications: Secondary | ICD-10-CM

## 2023-05-05 NOTE — Telephone Encounter (Signed)
Pt states that per his pharmacy he is needing prior authorization for his medication: Semaglutide, 1 MG/DOSE, 4 MG/3ML SOPN

## 2023-05-07 NOTE — Telephone Encounter (Signed)
Patient is requesting new prescription for Ozempic with refills. Last rx sent was 05/03/22 3 mg with 2 refills. Patient reports he has been taking medication continuous. Please advise.

## 2023-05-07 NOTE — Telephone Encounter (Signed)
PA started today.  

## 2023-05-07 NOTE — Telephone Encounter (Signed)
PA not started as insurance not found on patient.

## 2023-05-08 ENCOUNTER — Telehealth: Payer: Self-pay | Admitting: Family Medicine

## 2023-05-08 MED ORDER — SEMAGLUTIDE (1 MG/DOSE) 4 MG/3ML ~~LOC~~ SOPN: 1.0000 mg | PEN_INJECTOR | SUBCUTANEOUS | 2 refills | Status: DC

## 2023-05-08 NOTE — Telephone Encounter (Signed)
Covermymeds is requesting prior authorization Key: [id number] Name: [place] Ozempic 1mg /dose 4mg /9ml pen injectors

## 2023-05-15 NOTE — Telephone Encounter (Signed)
Ozempic approved for dates 05-12-2023 to 05-10-2024. Patient advised.

## 2023-05-15 NOTE — Telephone Encounter (Signed)
PA approved for Ozempic. Approval letter in chart.

## 2023-05-23 ENCOUNTER — Encounter: Payer: Self-pay | Admitting: Family Medicine

## 2023-05-23 ENCOUNTER — Ambulatory Visit: Payer: BC Managed Care – PPO | Admitting: Family Medicine

## 2023-05-23 VITALS — BP 123/73 | HR 66 | Temp 98.2°F | Resp 16 | Ht 67.0 in | Wt 312.4 lb

## 2023-05-23 DIAGNOSIS — I10 Essential (primary) hypertension: Secondary | ICD-10-CM | POA: Diagnosis not present

## 2023-05-23 DIAGNOSIS — E1140 Type 2 diabetes mellitus with diabetic neuropathy, unspecified: Secondary | ICD-10-CM

## 2023-05-23 DIAGNOSIS — G8929 Other chronic pain: Secondary | ICD-10-CM

## 2023-05-23 DIAGNOSIS — E781 Pure hyperglyceridemia: Secondary | ICD-10-CM

## 2023-05-23 DIAGNOSIS — E119 Type 2 diabetes mellitus without complications: Secondary | ICD-10-CM

## 2023-05-23 DIAGNOSIS — M25562 Pain in left knee: Secondary | ICD-10-CM

## 2023-05-23 LAB — POCT GLYCOSYLATED HEMOGLOBIN (HGB A1C): Hemoglobin A1C: 7.4 % — AB (ref 4.0–5.6)

## 2023-05-23 MED ORDER — NEOMYCIN-POLYMYXIN-HC 3.5-10000-1 OT SOLN: 3.0000 [drp] | Freq: Four times a day (QID) | OTIC | 0 refills | Status: AC

## 2023-05-23 MED ORDER — SEMAGLUTIDE (1 MG/DOSE) 4 MG/3ML ~~LOC~~ SOPN: 1.0000 mg | PEN_INJECTOR | SUBCUTANEOUS | 3 refills | Status: DC

## 2023-05-23 NOTE — Progress Notes (Signed)
Established patient visit   Patient: Adam Wolfe   DOB: Feb 18, 1966   57 y.o. Male  MRN: [id number] Visit Date: 05/23/2023  Today's healthcare provider: Mila Merry, MD   Chief Complaint  Patient presents with   Diabetes   Hypertension   Hyperlipidemia   Subjective    Discussed the use of AI scribe software for clinical note transcription with the patient, who gave verbal consent to proceed.  History of Present Illness   The patient, with a history of diabetes, has not been monitoring his blood sugar levels regularly. He reports not taking his prescribed Ozempic for the past three weeks due to an insurance change and lack of authorization. He continues to take metformin, but previously experienced adverse effects with a higher dose of Ozempic.  The patient also reports a persistent issue with his right ear, describing a loss of hearing and a daily discharge that doesn't resemble typical earwax. He has been using acetic acid drops without significant improvement.  Additionally, the patient has been experiencing significant right knee pain for the past month and a half, which has been exacerbated by walking on gravel at his new job. The pain is severe enough to affect his ability to climb stairs and walk downhill, requiring him to use a knee brace and modify his movements. He has not had any imaging studies for this issue.       Medications: Outpatient Medications Prior to Visit  Medication Sig   acetic acid 2 % otic solution Place 4 drops into the right ear every 3 (three) hours.   allopurinol (ZYLOPRIM) 300 MG tablet Take 1 tablet (300 mg total) by mouth daily.   gabapentin (NEURONTIN) 300 MG capsule TAKE 1 CAPSULE BY MOUTH EVERYDAY AT BEDTIME   glucose blood test strip Use to check sugar daily for type 2 diabetes E11.9   Insulin Pen Needle (NOVOFINE) 32G X 6 MM MISC Use to check blood sugar daily for type 2 diabetes.   metFORMIN (GLUCOPHAGE-XR) 500 MG 24 hr tablet TAKE  2 TABLETS (1,000 MG TOTAL) BY MOUTH 2 (TWO) TIMES DAILY BEFORE A MEAL.   ONETOUCH DELICA LANCETS FINE MISC Use to check blood sugar daily   rosuvastatin (CRESTOR) 10 MG tablet TAKE 1 TABLET BY MOUTH EVERY DAY   valsartan (DIOVAN) 80 MG tablet TAKE 1 TABLET BY MOUTH EVERY DAY   vitamin B-12 (CYANOCOBALAMIN) 1000 MCG tablet Take 1,000 mcg by mouth daily.   [DISCONTINUED] Semaglutide, 1 MG/DOSE, 4 MG/3ML SOPN Inject 1 mg as directed once a week.   No facility-administered medications prior to visit.   Review of Systems  Constitutional:  Negative for appetite change, chills and fever.  Respiratory:  Negative for chest tightness, shortness of breath and wheezing.   Cardiovascular:  Negative for chest pain and palpitations.  Gastrointestinal:  Negative for abdominal pain, nausea and vomiting.       Objective    BP 123/73 (BP Location: Left Arm, Patient Position: Sitting, Cuff Size: Large)   Pulse 66   Temp 98.2 F (36.8 C) (Temporal)   Resp 16   Ht 5\' 7"  (1.702 m)   Wt (!) 312 lb 6.4 oz (141.7 kg)   SpO2 97%   BMI 48.93 kg/m   Physical Exam  Right TM inflamed.     Results for orders placed or performed in visit on 05/23/23  POCT glycosylated hemoglobin (Hb A1C)  Result Value Ref Range   Hemoglobin A1C 7.4 (A) 4.0 - 5.6 %  Assessment & Plan     Assessment and Plan    Type 2 Diabetes Mellitus A1c increased to 7.4. Patient has not been checking blood sugars. Ozempic 1mg  has not been taken for 3 weeks due to insurance issues. Tolerating Metformin. -[facility] and confirm insurance approval. -Encourage patient to check blood sugars regularly. -Considering he does not tolerate higher doses of Ozempic, we may consider change to [facility] if A1c stays above 6.5 at follow up visit in 4 months.   Otitis Externa Right ear inflammation and hearing loss despite acetic acid treatment. -Prescribe Cortisporin ear drops. -If no improvement in 1 week, refer to  ENT.  Right Knee Pain Chronic pain exacerbated by walking on gravel at work. Difficulty with stairs and walking downhill. -Refer to Orthopedics for evaluation and possible imaging.  Colon polyp history  Pending due to insurance change. -Encourage patient to schedule colonoscopy once insurance is confirmed.  Follow-up in December 2024 for routine labs and to assess diabetes management.     Return in about 4 months (around 09/22/2023).      Mila Merry, MD  [facility] [phone] (phone) [phone] (fax)  [facility]

## 2023-05-23 NOTE — Patient Instructions (Signed)
Please review the attached list of medications and notify my office if there are any errors.   Call [facility] at [phone] to schedule your colonoscopy

## 2023-05-25 ENCOUNTER — Other Ambulatory Visit: Payer: Self-pay | Admitting: Family Medicine

## 2023-05-25 DIAGNOSIS — E119 Type 2 diabetes mellitus without complications: Secondary | ICD-10-CM

## 2023-05-27 NOTE — Telephone Encounter (Signed)
Requested Prescriptions  Pending Prescriptions Disp Refills   metFORMIN (GLUCOPHAGE-XR) 500 MG 24 hr tablet [Pharmacy Med Name: METFORMIN HCL ER 500 MG TABLET] 360 tablet 0    Sig: TAKE 2 TABLETS (1,000 MG TOTAL) BY MOUTH 2 (TWO) TIMES DAILY BEFORE A MEAL.     Endocrinology:  Diabetes - Biguanides Failed - 05/25/2023  2:33 PM      Failed - B12 Level in normal range and within 720 days    No results found for: "VITAMINB12"       Failed - CBC within normal limits and completed in the last 12 months    WBC  Date Value Ref Range Status  09/13/2022 8.2 3.4 - 10.8 x10E3/uL Final  05/02/2016 8.0 3.8 - 10.6 K/uL Final   RBC  Date Value Ref Range Status  09/13/2022 4.77 4.14 - 5.80 x10E6/uL Final  05/02/2016 4.85 4.40 - 5.90 MIL/uL Final   Hemoglobin  Date Value Ref Range Status  09/13/2022 15.4 13.0 - 17.7 g/dL Final   Hematocrit  Date Value Ref Range Status  09/13/2022 44.5 37.5 - 51.0 % Final   MCHC  Date Value Ref Range Status  09/13/2022 34.6 31.5 - 35.7 g/dL Final  16/07/9603 54.0 32.0 - 36.0 g/dL Final   [facility]  Date Value Ref Range Status  09/13/2022 32.3 26.6 - 33.0 pg Final  05/02/2016 31.9 26.0 - 34.0 pg Final   MCV  Date Value Ref Range Status  09/13/2022 93 79 - 97 fL Final   No results found for: "PLTCOUNTKUC", "LABPLAT", "POCPLA" RDW  Date Value Ref Range Status  09/13/2022 13.2 11.6 - 15.4 % Final         Passed - Cr in normal range and within 360 days    Creat  Date Value Ref Range Status  07/03/2017 1.09 0.60 - 1.35 mg/dL Final   Creatinine, Ser  Date Value Ref Range Status  09/13/2022 1.01 0.76 - 1.27 mg/dL Final   Creatinine, POC  Date Value Ref Range Status  11/05/2017 n/a mg/dL Final         Passed - HBA1C is between 0 and 7.9 and within 180 days    Hemoglobin A1C  Date Value Ref Range Status  05/23/2023 7.4 (A) 4.0 - 5.6 % Final   Hgb A1c MFr Bld  Date Value Ref Range Status  05/03/2022 6.5 (H) 4.8 - 5.6 % Final    Comment:              Prediabetes: 5.7 - 6.4          Diabetes: >6.4          Glycemic control for adults with diabetes: <7.0          Passed - eGFR in normal range and within 360 days    GFR, Est African American  Date Value Ref Range Status  07/03/2017 92 > OR = 60 mL/min/1.58m2 Final   GFR calc Af Amer  Date Value Ref Range Status  03/21/2020 102 >59 mL/min/1.73 Final    Comment:    **[facility] currently reports eGFR in compliance with the current**   recommendations of the [employer]. [facility] will   update reporting as new guidelines are published from the NKF-ASN   Task force.    GFR, Est Non African American  Date Value Ref Range Status  07/03/2017 79 > OR = 60 mL/min/1.8m2 Final   GFR calc non Af Amer  Date Value Ref Range Status  03/21/2020 88 >59  mL/min/1.73 Final   eGFR  Date Value Ref Range Status  09/13/2022 88 >59 mL/min/1.73 Final         Passed - Valid encounter within last 6 months    Recent Outpatient Visits           4 days ago Chronic pain of left knee   [facility] Malva Limes, MD   2 months ago Obstructive sleep apnea   [place] [facility] Malva Limes, MD   5 months ago Type 2 diabetes mellitus with diabetic neuropathy, without long-term current use of insulin (HCC)   [place] [facility] Malva Limes, MD   8 months ago Generalized anxiety disorder   [facility] Malva Limes, MD   11 months ago Type 2 diabetes mellitus with diabetic neuropathy, without long-term current use of insulin (HCC)   [place] [facility] Malva Limes, MD       Future Appointments             In 4 months Fisher, Demetrios Isaacs, MD [facility], [facility]

## 2023-05-30 ENCOUNTER — Telehealth: Payer: Self-pay | Admitting: Family Medicine

## 2023-05-30 NOTE — Telephone Encounter (Signed)
PA initiated

## 2023-05-30 NOTE — Telephone Encounter (Signed)
Covermymeds is requesting prior authorization Key: [id number] Name: [place] Ozempic 1mg /dose 4mg /61ml pen injectors

## 2023-06-01 DIAGNOSIS — G4733 Obstructive sleep apnea (adult) (pediatric): Secondary | ICD-10-CM | POA: Diagnosis not present

## 2023-06-02 NOTE — Telephone Encounter (Signed)
Message from Plan Authorization On File.Authorization On File.Please note this request does not require prior authorization review. Auth on file under Ref# ([id number]) and is effective until 05/10/2024. Paid claim on file on 05/23/2023; REFILL PAYABLE ON OR AFTER 06-13-23. Please contact pharmacy to process prescription when eligible/due for refill(s). Thank you!

## 2023-06-23 NOTE — Telephone Encounter (Signed)
Message from Plan Authorization On File.Authorization On File.Please note this request does not require prior authorization review. Auth on file under Ref# ([id number]) and is effective until 05/10/2024. Paid claim on file on 05/23/2023; REFILL PAYABLE ON OR AFTER 06-13-23. Please contact pharmacy to process prescription when eligible/due for refill(s). Thank you!

## 2023-06-23 NOTE — Telephone Encounter (Signed)
Received a fax from covermymeds for Ozempic 1mg /dose 4mg /77ml  Key: [id number]

## 2023-06-23 NOTE — Telephone Encounter (Signed)
Per pharmacist no PA needed. Patient had requested refill too soon. Patient picked up prescription 09/04

## 2023-06-25 DIAGNOSIS — M1712 Unilateral primary osteoarthritis, left knee: Secondary | ICD-10-CM | POA: Diagnosis not present

## 2023-07-01 ENCOUNTER — Other Ambulatory Visit: Payer: Self-pay | Admitting: Family Medicine

## 2023-07-01 DIAGNOSIS — I10 Essential (primary) hypertension: Secondary | ICD-10-CM

## 2023-07-01 DIAGNOSIS — E781 Pure hyperglyceridemia: Secondary | ICD-10-CM

## 2023-07-02 DIAGNOSIS — G4733 Obstructive sleep apnea (adult) (pediatric): Secondary | ICD-10-CM | POA: Diagnosis not present

## 2023-08-24 ENCOUNTER — Other Ambulatory Visit: Payer: Self-pay | Admitting: Family Medicine

## 2023-08-24 DIAGNOSIS — E119 Type 2 diabetes mellitus without complications: Secondary | ICD-10-CM

## 2023-09-24 ENCOUNTER — Ambulatory Visit: Payer: BC Managed Care – PPO | Admitting: Family Medicine

## 2023-09-30 ENCOUNTER — Other Ambulatory Visit: Payer: Self-pay | Admitting: Family Medicine

## 2023-10-01 ENCOUNTER — Ambulatory Visit: Payer: BC Managed Care – PPO | Admitting: Family Medicine

## 2023-10-01 VITALS — BP 141/88 | HR 74 | Ht 67.0 in | Wt 316.0 lb

## 2023-10-01 DIAGNOSIS — Z125 Encounter for screening for malignant neoplasm of prostate: Secondary | ICD-10-CM | POA: Diagnosis not present

## 2023-10-01 DIAGNOSIS — I10 Essential (primary) hypertension: Secondary | ICD-10-CM

## 2023-10-01 DIAGNOSIS — E1140 Type 2 diabetes mellitus with diabetic neuropathy, unspecified: Secondary | ICD-10-CM

## 2023-10-01 DIAGNOSIS — E781 Pure hyperglyceridemia: Secondary | ICD-10-CM | POA: Diagnosis not present

## 2023-10-01 DIAGNOSIS — Z7985 Long-term (current) use of injectable non-insulin antidiabetic drugs: Secondary | ICD-10-CM

## 2023-10-01 LAB — POCT GLYCOSYLATED HEMOGLOBIN (HGB A1C): Hemoglobin A1C: 7 % — AB (ref 4.0–5.6)

## 2023-10-01 MED ORDER — GABAPENTIN 300 MG PO CAPS: ORAL_CAPSULE | ORAL | 4 refills | Status: AC

## 2023-10-01 NOTE — Patient Instructions (Signed)
.   Please review the attached list of medications and notify my office if there are any errors.   . Please bring all of your medications to every appointment so we can make sure that our medication list is the same as yours.   

## 2023-10-02 LAB — COMPREHENSIVE METABOLIC PANEL
ALT: 41 [IU]/L (ref 0–44)
AST: 33 [IU]/L (ref 0–40)
Albumin: 4.1 g/dL (ref 3.8–4.9)
Alkaline Phosphatase: 66 [IU]/L (ref 44–121)
BUN/Creatinine Ratio: 15 (ref 9–20)
BUN: 17 mg/dL (ref 6–24)
Bilirubin Total: 0.4 mg/dL (ref 0.0–1.2)
CO2: 20 mmol/L (ref 20–29)
Calcium: 9.4 mg/dL (ref 8.7–10.2)
Chloride: 100 mmol/L (ref 96–106)
Creatinine, Ser: 1.13 mg/dL (ref 0.76–1.27)
Globulin, Total: 2.4 g/dL (ref 1.5–4.5)
Glucose: 150 mg/dL — ABNORMAL HIGH (ref 70–99)
Potassium: 4.7 mmol/L (ref 3.5–5.2)
Sodium: 140 mmol/L (ref 134–144)
Total Protein: 6.5 g/dL (ref 6.0–8.5)
eGFR: 77 mL/min/{1.73_m2} (ref >59)

## 2023-10-02 LAB — LIPID PANEL
Chol/HDL Ratio: 4 {ratio} (ref 0.0–5.0)
Cholesterol, Total: 107 mg/dL (ref 100–199)
HDL: 27 mg/dL — ABNORMAL LOW (ref >39)
LDL Chol Calc (NIH): 30 mg/dL (ref 0–99)
Triglycerides: 346 mg/dL — ABNORMAL HIGH (ref 0–149)
VLDL Cholesterol Cal: 50 mg/dL — ABNORMAL HIGH (ref 5–40)

## 2023-10-02 LAB — PSA: Prostate Specific Ag, Serum: 1.2 ng/mL (ref 0.0–4.0)

## 2023-10-02 LAB — CBC
Hematocrit: 45.6 % (ref 37.5–51.0)
Hemoglobin: 14.8 g/dL (ref 13.0–17.7)
MCH: 30.5 pg (ref 26.6–33.0)
MCHC: 32.5 g/dL (ref 31.5–35.7)
MCV: 94 fL (ref 79–97)
Platelets: 213 10*3/uL (ref 150–450)
RBC: 4.86 x10E6/uL (ref 4.14–5.80)
RDW: 13.6 % (ref 11.6–15.4)
WBC: 6.4 10*3/uL (ref 3.4–10.8)

## 2023-10-02 LAB — MICROALBUMIN / CREATININE URINE RATIO
Creatinine, Urine: 89.1 mg/dL
Microalb/Creat Ratio: <3 mg/g{creat} (ref 0–29)
Microalbumin, Urine: <3 ug/mL

## 2023-10-02 LAB — TSH: TSH: 2.37 u[IU]/mL (ref 0.450–4.500)

## 2023-10-09 NOTE — Progress Notes (Signed)
Established patient visit   Patient: Adam Wolfe   DOB: Nov 04, 1965   57 y.o. Male  MRN: [id number] Visit Date: 10/01/2023  Today's healthcare provider: Mila Merry, MD   Chief Complaint  Patient presents with   Follow-up   Diabetes   Hyperlipidemia   Subjective    Diabetes Pertinent negatives for diabetes include no chest pain.  Hyperlipidemia Pertinent negatives include no chest pain or shortness of breath.    Follow up dm and lipoids. Doing well current meds and current dose Ozempic. Did not tolerating 2mg  in the past. Feels well. Gabapentin remains effective for neuropathy. No side effects.   Medications: Outpatient Medications Prior to Visit  Medication Sig   acetic acid 2 % otic solution Place 4 drops into the right ear every 3 (three) hours.   allopurinol (ZYLOPRIM) 300 MG tablet Take 1 tablet (300 mg total) by mouth daily.   glucose blood test strip Use to check sugar daily for type 2 diabetes E11.9   Insulin Pen Needle (NOVOFINE) 32G X 6 MM MISC Use to check blood sugar daily for type 2 diabetes.   metFORMIN (GLUCOPHAGE-XR) 500 MG 24 hr tablet TAKE 2 TABLETS (1,000 MG TOTAL) BY MOUTH 2 (TWO) TIMES DAILY BEFORE A MEAL.   ONETOUCH DELICA LANCETS FINE MISC Use to check blood sugar daily   rosuvastatin (CRESTOR) 10 MG tablet TAKE 1 TABLET BY MOUTH EVERY DAY   Semaglutide, 1 MG/DOSE, 4 MG/3ML SOPN Inject 1 mg as directed once a week.   valsartan (DIOVAN) 80 MG tablet TAKE 1 TABLET BY MOUTH EVERY DAY   vitamin B-12 (CYANOCOBALAMIN) 1000 MCG tablet Take 1,000 mcg by mouth daily.   gabapentin (NEURONTIN) 300 MG capsule TAKE 1 CAPSULE BY MOUTH EVERYDAY AT BEDTIME   No facility-administered medications prior to visit.    Review of Systems  Constitutional:  Negative for appetite change, chills and fever.  Respiratory:  Negative for chest tightness, shortness of breath and wheezing.   Cardiovascular:  Negative for chest pain and palpitations.  Gastrointestinal:   Negative for abdominal pain, nausea and vomiting.       Objective    BP (!) 141/88   Pulse 74   Ht 5\' 7"  (1.702 m)   Wt (!) 316 lb (143.3 kg)   SpO2 97%   BMI 49.49 kg/m    Physical Exam  General appearance: Severely obese male, cooperative and in no acute distress Head: Normocephalic, without obvious abnormality, atraumatic Respiratory: Respirations even and unlabored, normal respiratory rate Extremities: All extremities are intact.  Skin: Skin color, texture, turgor normal. No rashes seen  Psych: Appropriate mood and affect. Neurologic: Mental status: Alert, oriented to person, place, and time, thought content appropriate.   Lab Results  Component Value Date   HGBA1C 7.0 (A) 10/01/2023      Assessment & Plan     1. Type 2 diabetes mellitus with diabetic neuropathy, without long-term current use of insulin (HCC) (Primary) Stable on current Ozempic dose. Did not tolerate 2mg  in the past. Continue current medications.  Consider change to [facility] if A1c does not continue to improve.   refill gabapentin (NEURONTIN) 300 MG capsule; TAKE 1 CAPSULE BY MOUTH EVERYDAY AT BEDTIME  Dispense: 90 capsule; Refill: 4 - Urine microalbumin-creatinine with uACR - Comprehensive metabolic panel  2. Morbid obesity (HCC) Continue to work on healthy diet, low portions, and staying physically active.   3. Essential hypertension Stable. Continue current medications.    4. Hypertriglyceridemia He is  tolerating pravastatin well with no adverse effects.   - CBC - TSH - Lipid panel  5. Prostate cancer screening  - PSA   Return in about 6 months (around 03/31/2024) for Diabetes.        Mila Merry, MD  [facility] [phone] (phone) [phone] (fax)  [facility]

## 2023-11-26 ENCOUNTER — Other Ambulatory Visit: Payer: Self-pay | Admitting: Family Medicine

## 2023-11-26 DIAGNOSIS — E119 Type 2 diabetes mellitus without complications: Secondary | ICD-10-CM

## 2023-12-06 ENCOUNTER — Other Ambulatory Visit: Payer: Self-pay | Admitting: Family Medicine

## 2023-12-06 DIAGNOSIS — E119 Type 2 diabetes mellitus without complications: Secondary | ICD-10-CM

## 2024-02-15 ENCOUNTER — Other Ambulatory Visit: Payer: Self-pay | Admitting: Family Medicine

## 2024-02-15 DIAGNOSIS — E781 Pure hyperglyceridemia: Secondary | ICD-10-CM

## 2024-02-25 ENCOUNTER — Other Ambulatory Visit: Payer: Self-pay | Admitting: Family Medicine

## 2024-02-25 DIAGNOSIS — E119 Type 2 diabetes mellitus without complications: Secondary | ICD-10-CM

## 2024-03-04 ENCOUNTER — Telehealth: Payer: Self-pay

## 2024-03-04 NOTE — Telephone Encounter (Signed)
 Copied from CRM [id number]. Topic: Clinical - Medication Question >> Mar 04, 2024 11:09 AM Bridgette Campus T wrote: Reason for CRM: Semaglutide , 1 MG/DOSE, (OZEMPIC , 1 MG/DOSE,) 4 MG/3ML SOPN - costs too much, wants to know if there is a cheaper alternative, please call [phone]

## 2024-03-04 NOTE — Telephone Encounter (Signed)
 No, but he can go to [email or web address] and sign up for copay discount that will reduce copay $100 per month.

## 2024-03-04 NOTE — Telephone Encounter (Signed)
 Spoke with patient and advised of recommendation of signing up fthrough the [email or web address] website. Patient stated of already doing so already, price originally was $141 dropped to $74. Patient still thinks its still too much to pay monthly. 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 States he can pay it this one time if he needs too, but prefers to not do so. I advised of letting provider know and call him back with recommendations.

## 2024-03-05 NOTE — Telephone Encounter (Signed)
 That's actually a really good deal for Ozempic  and there are no alternatives that provide similar health benefits.

## 2024-03-05 NOTE — Telephone Encounter (Signed)
 Detailed VM left per DPR. Ok to advise per Dr.Fisher if call is returned

## 2024-04-01 ENCOUNTER — Telehealth: Payer: Self-pay | Admitting: Family Medicine

## 2024-04-01 NOTE — Telephone Encounter (Unsigned)
 Copied from CRM [id number]. Topic: Clinical - Medication Refill >> Apr 01, 2024  2:14 PM Shardie S wrote: Medication: allopurinol  (ZYLOPRIM ) 300 MG tablet  Has the patient contacted their pharmacy? Yes, contact PCP (Agent: If no, request that the patient contact the pharmacy for the refill. If patient does not wish to contact the pharmacy document the reason why and proceed with request.) (Agent: If yes, when and what did the pharmacy advise?)  This is the patient's preferred pharmacy:  [id number] [place] [facility] - [address] [place] [address] Phone: [phone] Fax: [phone]  Is this the correct pharmacy for this prescription? Yes If no, delete pharmacy and type the correct one.   Has the prescription been filled recently? No  Is the patient out of the medication? Yes  Has the patient been seen for an appointment in the last year OR does the patient have an upcoming appointment? Yes  Can we respond through MyChart? Yes  Agent: Please be advised that Rx refills may take up to 3 business days. We ask that you follow-up with your pharmacy.

## 2024-04-02 ENCOUNTER — Ambulatory Visit: Payer: Self-pay | Admitting: Family Medicine

## 2024-04-02 DIAGNOSIS — E1140 Type 2 diabetes mellitus with diabetic neuropathy, unspecified: Secondary | ICD-10-CM

## 2024-04-05 MED ORDER — ALLOPURINOL 300 MG PO TABS: 300.0000 mg | ORAL_TABLET | Freq: Every day | ORAL | 0 refills | Status: DC

## 2024-04-05 NOTE — Telephone Encounter (Signed)
 Requested medication (s) are due for refill today: yes  Requested medication (s) are on the active medication list: yes  Last refill:  03/04/23 #90/4  Future visit scheduled: yes  Notes to clinic:  Unable to refill per protocol due to failed labs, no updated results.      Requested Prescriptions  Pending Prescriptions Disp Refills   allopurinol  (ZYLOPRIM ) 300 MG tablet 90 tablet 4    Sig: Take 1 tablet (300 mg total) by mouth daily.     Endocrinology:  Gout Agents - allopurinol  Failed - 04/05/2024 11:52 AM      Failed - Uric Acid in normal range and within 360 days    No results found for: POCURA, LABURIC       Failed - Valid encounter within last 12 months    Recent Outpatient Visits   None            Failed - CBC within normal limits and completed in the last 12 months    WBC  Date Value Ref Range Status  10/01/2023 6.4 3.4 - 10.8 x10E3/uL Final  05/02/2016 8.0 3.8 - 10.6 K/uL Final   RBC  Date Value Ref Range Status  10/01/2023 4.86 4.14 - 5.80 x10E6/uL Final  05/02/2016 4.85 4.40 - 5.90 MIL/uL Final   Hemoglobin  Date Value Ref Range Status  10/01/2023 14.8 13.0 - 17.7 g/dL Final   Hematocrit  Date Value Ref Range Status  10/01/2023 45.6 37.5 - 51.0 % Final   MCHC  Date Value Ref Range Status  10/01/2023 32.5 31.5 - 35.7 g/dL Final  92/79/7982 64.3 32.0 - 36.0 g/dL Final   [facility]  Date Value Ref Range Status  10/01/2023 30.5 26.6 - 33.0 pg Final  05/02/2016 31.9 26.0 - 34.0 pg Final   MCV  Date Value Ref Range Status  10/01/2023 94 79 - 97 fL Final   No results found for: PLTCOUNTKUC, LABPLAT, POCPLA RDW  Date Value Ref Range Status  10/01/2023 13.6 11.6 - 15.4 % Final         Passed - Cr in normal range and within 360 days    Creat  Date Value Ref Range Status  07/03/2017 1.09 0.60 - 1.35 mg/dL Final   Creatinine, Ser  Date Value Ref Range Status  10/01/2023 1.13 0.76 - 1.27 mg/dL Final   Creatinine, POC  Date Value Ref Range  Status  11/05/2017 n/a mg/dL Final

## 2024-04-22 ENCOUNTER — Other Ambulatory Visit: Payer: Self-pay | Admitting: Family Medicine

## 2024-04-22 DIAGNOSIS — E119 Type 2 diabetes mellitus without complications: Secondary | ICD-10-CM

## 2024-05-12 ENCOUNTER — Ambulatory Visit: Payer: Self-pay | Admitting: Family Medicine

## 2024-05-12 ENCOUNTER — Encounter: Payer: Self-pay | Admitting: Family Medicine

## 2024-05-12 VITALS — BP 125/82 | HR 65 | Temp 97.9°F | Ht 67.0 in | Wt 311.7 lb

## 2024-05-12 DIAGNOSIS — Z7984 Long term (current) use of oral hypoglycemic drugs: Secondary | ICD-10-CM | POA: Diagnosis not present

## 2024-05-12 DIAGNOSIS — E1140 Type 2 diabetes mellitus with diabetic neuropathy, unspecified: Secondary | ICD-10-CM

## 2024-05-12 LAB — POCT GLYCOSYLATED HEMOGLOBIN (HGB A1C): Hemoglobin A1C: 8.4 % — AB (ref 4.0–5.6)

## 2024-05-12 MED ORDER — OZEMPIC (2 MG/DOSE) 8 MG/3ML ~~LOC~~ SOPN: 2.0000 mg | PEN_INJECTOR | SUBCUTANEOUS | 3 refills | Status: DC

## 2024-05-12 NOTE — Patient Instructions (Signed)
 Marland Kitchen  Please review the attached list of medications and notify my office if there are any errors.   . Please bring all of your medications to every appointment so we can make sure that our medication list is the same as yours.

## 2024-05-12 NOTE — Progress Notes (Signed)
 Established patient visit   Patient: Adam Wolfe   DOB: 05/16/1966   58 y.o. Male  MRN: [id number] Visit Date: 05/12/2024  Today's healthcare provider: Nancyann Perry, MD   Chief Complaint  Patient presents with   Medical Management of Chronic Issues    dm2   Subjective    HPI Follow up dm and hypertension. Doing well on current dose metformin  and Ozempic . Doesn't check sugars. No changes in diet. No formal exercise. Had been on 2mg  Ozempic  in 2023 but cut back down to 1mg  due to GI side effects.   Lab Results  Component Value Date   HGBA1C 7.0 (A) 10/01/2023   HGBA1C 7.4 (A) 05/23/2023   HGBA1C 7.0 (A) 12/13/2022   Wt Readings from Last 3 Encounters:  05/12/24 (!) 311 lb 11.2 oz (141.4 kg)  10/01/23 (!) 316 lb (143.3 kg)  05/23/23 (!) 312 lb 6.4 oz (141.7 kg)   Lab Results  Component Value Date   NA 140 10/01/2023   K 4.7 10/01/2023   CREATININE 1.13 10/01/2023   EGFR 77 10/01/2023   GLUCOSE 150 (H) 10/01/2023     Medications: Outpatient Medications Prior to Visit  Medication Sig   acetic acid  2 % otic solution Place 4 drops into the right ear every 3 (three) hours.   allopurinol  (ZYLOPRIM ) 300 MG tablet Take 1 tablet (300 mg total) by mouth daily.   gabapentin  (NEURONTIN ) 300 MG capsule TAKE 1 CAPSULE BY MOUTH EVERYDAY AT BEDTIME   glucose blood test strip Use to check sugar daily for type 2 diabetes E11.9   Insulin  Pen Needle (NOVOFINE) 32G X 6 MM MISC Use to check blood sugar daily for type 2 diabetes.   meloxicam (MOBIC) 7.5 MG tablet Take 7.5 mg by mouth 2 (two) times daily with a meal.   metFORMIN  (GLUCOPHAGE -XR) 500 MG 24 hr tablet TAKE 2 TABLETS (1,000 MG TOTAL) BY MOUTH 2 (TWO) TIMES DAILY BEFORE A MEAL.   ONETOUCH DELICA LANCETS FINE MISC Use to check blood sugar daily   rosuvastatin  (CRESTOR ) 10 MG tablet TAKE 1 TABLET BY MOUTH EVERY DAY   valsartan  (DIOVAN ) 80 MG tablet TAKE 1 TABLET BY MOUTH EVERY DAY   vitamin B-12 (CYANOCOBALAMIN) 1000 MCG  tablet Take 1,000 mcg by mouth daily.   [DISCONTINUED] Semaglutide , 1 MG/DOSE, (OZEMPIC , 1 MG/DOSE,) 4 MG/3ML SOPN INJECT 1 MG ONCE A WEEK AS DIRECTED   No facility-administered medications prior to visit.    Review of Systems  Constitutional:  Negative for appetite change, chills and fever.  Respiratory:  Negative for chest tightness, shortness of breath and wheezing.   Cardiovascular:  Negative for chest pain and palpitations.  Gastrointestinal:  Negative for abdominal pain, nausea and vomiting.       Objective    BP 125/82 (BP Location: Left Arm, Patient Position: Sitting, Cuff Size: Normal)   Pulse 65   Temp 97.9 F (36.6 C) (Oral)   Ht 5' 7 (1.702 m)   Wt (!) 311 lb 11.2 oz (141.4 kg)   SpO2 96%   BMI 48.82 kg/m    Physical Exam   General appearance: Obese male, cooperative and in no acute distress Head: Normocephalic, without obvious abnormality, atraumatic Respiratory: Respirations even and unlabored, normal respiratory rate Extremities: All extremities are intact.  Skin: Skin color, texture, turgor normal. No rashes seen  Psych: Appropriate mood and affect. Neurologic: Mental status: Alert, oriented to person, place, and time, thought content appropriate.   Results for orders placed  or performed in visit on 05/12/24  POCT HgB A1C  Result Value Ref Range   Hemoglobin A1C 8.4 (A) 4.0 - 5.6 %    Assessment & Plan     1. Type 2 diabetes mellitus with diabetic neuropathy, without long-term current use of insulin  (HCC) (Primary) Tolerating 1mg  well, but not at goal and had GI side effects with 2 mg when last tried in 2023.   Can try intermediate dose by injection 3/4 dose of Semaglutide , 2 MG/DOSE, (OZEMPIC , 2 MG/DOSE,) 8 MG/3ML SOPN; Inject 2 mg into the skin once a week.  Dispense: 3 mL; Refill: 3    Return in about 20 weeks (around 09/29/2024) for Yearly Physical, Diabetes.         Nancyann Perry, MD  [facility] [phone]  (phone) [phone] (fax)  [facility]

## 2024-05-25 ENCOUNTER — Telehealth: Payer: Self-pay

## 2024-05-25 ENCOUNTER — Other Ambulatory Visit (HOSPITAL_COMMUNITY): Payer: Self-pay

## 2024-05-25 NOTE — Telephone Encounter (Signed)
 Pharmacy Patient Advocate Encounter   Received notification from CoverMyMeds that prior authorization for Ozempic  2mg /dose is required/requested.   Insurance verification completed.   The patient is insured through [facility] .   Per test claim: The current 28 day co-pay is, $80.  No PA needed at this time. This test claim was processed through [facility]- copay amounts may vary at other pharmacies due to pharmacy/plan contracts, or as the patient moves through the different stages of their insurance plan.

## 2024-06-01 ENCOUNTER — Encounter: Payer: Self-pay | Admitting: Family Medicine

## 2024-06-30 ENCOUNTER — Other Ambulatory Visit: Payer: Self-pay | Admitting: Family Medicine

## 2024-07-30 NOTE — Progress Notes (Signed)
 Adam Wolfe                                          MRN: [id number]   07/30/2024   The [facility]BCI Quality Team Specialist reviewed this patient medical record for the purposes of chart review for care gap closure. The following were reviewed: chart review for care gap closure-kidney health evaluation for diabetes:eGFR  and uACR.    VBCI Quality Team

## 2024-07-30 NOTE — Progress Notes (Signed)
 Adam Wolfe                                          MRN: [id number]   07/30/2024   The VBCI Quality Team Specialist reviewed this patient medical record for the purposes of chart review for care gap closure. The following were reviewed: chart review for care gap closure-glycemic status assessment.    VBCI Quality Team

## 2024-08-12 ENCOUNTER — Other Ambulatory Visit: Payer: Self-pay | Admitting: Family Medicine

## 2024-08-12 DIAGNOSIS — I10 Essential (primary) hypertension: Secondary | ICD-10-CM

## 2024-08-26 ENCOUNTER — Ambulatory Visit (INDEPENDENT_AMBULATORY_CARE_PROVIDER_SITE_OTHER)

## 2024-08-26 VITALS — BP 128/83 | HR 89

## 2024-08-26 DIAGNOSIS — Z7985 Long-term (current) use of injectable non-insulin antidiabetic drugs: Secondary | ICD-10-CM | POA: Diagnosis not present

## 2024-08-26 DIAGNOSIS — E1140 Type 2 diabetes mellitus with diabetic neuropathy, unspecified: Secondary | ICD-10-CM

## 2024-08-26 DIAGNOSIS — Z7984 Long term (current) use of oral hypoglycemic drugs: Secondary | ICD-10-CM

## 2024-08-26 LAB — POCT GLYCOSYLATED HEMOGLOBIN (HGB A1C): Hemoglobin A1C: 6.4 % — AB (ref 4.0–5.6)

## 2024-08-26 NOTE — Progress Notes (Signed)
 S:     Reason for visit: ?  Adam Wolfe is a 58 y.o. male with a history of diabetes (type 2), who presents today for an initial diabetes Face to Face pharmacotherapy visit.? Pertinent PMH also includes HTN, OSA, gout, HLD, obesity, and anxiety.  Care Team: Primary Care Provider: Gasper Nancyann BRAVO, MD  At last visit with PCP on 05/12/24, A1c was uncontrolled at 8.4%. Patient reported decreasing to Ozempic  1 mg weekly d/t GI side effects.  Today, he reports increasing from Ozempic  1 mg to 2 mg. Reports some GI SDE when he overeats, but otherwise, is doing well on the dose change.  Current diabetes medications include: metformin  XR 2 tablets BID, Ozempic  2 mg weekly  Current hypertension medications include: valsartan  80 mg daily Current hyperlipidemia medications include: rosuvastatin  10 mg daily  Patient reports adherence to taking all medications as prescribed.   Have you been experiencing any side effects to the medications prescribed? Yes - some GI upset with Ozempic  when he overeats Do you have any problems obtaining medications due to transportation or finances? no Insurance coverage: BCBS  Patient denies hypoglycemic events.  Reported home fasting blood sugars: not checking DM Prevention:  Statin: Taking; moderate intensity.?  ACE/ARB: yes; valsartan  Last urinary albumin/creatinine ratio:  Lab Results  Component Value Date   MICRALBCREAT <3 10/01/2023   MICRALBCREAT n/a 11/05/2017   Last eye exam:  Lab Results  Component Value Date   HMDIABEYEEXA No Retinopathy 10/05/2021   Lab Results  Component Value Date   HMDIABEYEEXA No Retinopathy 10/05/2021   Last foot exam: No foot exam found Tobacco Use:  Tobacco Use: High Risk (05/12/2024)   Patient History    Smoking Tobacco Use: Never    Smokeless Tobacco Use: Current    Passive Exposure: Not on file   O:   Vitals:  Wt Readings from Last 3 Encounters:  05/12/24 (!) 311 lb 11.2 oz (141.4 kg)  10/01/23 (!)  316 lb (143.3 kg)  05/23/23 (!) 312 lb 6.4 oz (141.7 kg)   BP Readings from Last 3 Encounters:  05/12/24 125/82  10/01/23 (!) 141/88  05/23/23 123/73   Pulse Readings from Last 3 Encounters:  05/12/24 65  10/01/23 74  05/23/23 66     Labs:?  Lab Results  Component Value Date   HGBA1C 8.4 (A) 05/12/2024   HGBA1C 7.0 (A) 10/01/2023   HGBA1C 7.4 (A) 05/23/2023   GLUCOSE 150 (H) 10/01/2023   MICRALBCREAT <3 10/01/2023   MICRALBCREAT n/a 11/05/2017   CREATININE 1.13 10/01/2023   CREATININE 1.01 09/13/2022   CREATININE 1.00 07/27/2021    Lab Results  Component Value Date   CHOL 107 10/01/2023   LDLCALC 30 10/01/2023   LDLCALC 41 09/13/2022   LDLCALC 38 07/27/2021   HDL 27 (L) 10/01/2023   TRIG 346 (H) 10/01/2023   TRIG 226 (H) 09/13/2022   TRIG 147 07/27/2021   ALT 41 10/01/2023   ALT 40 09/13/2022   AST 33 10/01/2023   AST 30 09/13/2022      Chemistry      Component Value Date/Time   NA 140 10/01/2023 0839   K 4.7 10/01/2023 0839   CL 100 10/01/2023 0839   CO2 20 10/01/2023 0839   BUN 17 10/01/2023 0839   CREATININE 1.13 10/01/2023 0839   CREATININE 1.09 07/03/2017 1203   GLU 107 09/02/2013 0000      Component Value Date/Time   CALCIUM  9.4 10/01/2023 0839   ALKPHOS 66 10/01/2023 0839  AST 33 10/01/2023 0839   ALT 41 10/01/2023 0839   BILITOT 0.4 10/01/2023 0839       The ASCVD Risk score (Arnett DK, et al., 2019) failed to calculate for the following reasons:   The valid total cholesterol range is 130 to 320 mg/dL  Lab Results  Component Value Date   MICRALBCREAT <3 10/01/2023   MICRALBCREAT n/a 11/05/2017    A/P: Diabetes currently controlled with a most recent A1c of 6.4% on 08/26/24. Medication adherence appears appropriate. Some GI distress when patient overeats, but otherwise, denies any issues with Ozempic  therapy. Will maintain current regimen at this time. -Continued GLP-1 Ozempic  (semaglutide ) 2 mg weekly.  -Continued metformin  XR 500  mg 2 tablets BID.  -Extensively discussed pathophysiology of diabetes, recommended lifestyle interventions, dietary effects on blood sugar control.  -Counseled on s/sx of and management of hypoglycemia.  -Obtained updated UACR today  ASCVD risk - primary prevention in patient with diabetes. Last LDL is 30 mg/dL, at goal of <29 mg/dL.  -Continued rosuvastatin  10 mg daily.   Hypertension longstanding currently controlled. Blood pressure goal of <130/80 mmHg. Medication adherence appropriate.  -Continued valsartan  80 mg daily.  Patient verbalized understanding of treatment plan. Total time patient counseling 30 minutes.  Follow-up:  PCP clinic visit on 10/01/24  Peyton CHARLENA Ferries, PharmD, CPP Clinical Pharmacist [facility] [phone]

## 2024-08-27 LAB — SPECIMEN STATUS REPORT

## 2024-08-27 LAB — MICROALBUMIN / CREATININE URINE RATIO
Creatinine, Urine: 131 mg/dL
Microalb/Creat Ratio: 4 mg/g{creat} (ref 0–29)
Microalbumin, Urine: 5 ug/mL

## 2024-08-29 ENCOUNTER — Ambulatory Visit: Payer: Self-pay | Admitting: Family Medicine

## 2024-09-09 ENCOUNTER — Other Ambulatory Visit: Payer: Self-pay | Admitting: Family Medicine

## 2024-09-09 DIAGNOSIS — E1140 Type 2 diabetes mellitus with diabetic neuropathy, unspecified: Secondary | ICD-10-CM

## 2024-09-30 ENCOUNTER — Other Ambulatory Visit: Payer: Self-pay | Admitting: Family Medicine

## 2024-10-01 ENCOUNTER — Encounter: Payer: Self-pay | Admitting: Family Medicine

## 2024-10-01 ENCOUNTER — Ambulatory Visit: Admitting: Family Medicine

## 2024-10-01 VITALS — BP 138/88 | HR 90 | Temp 97.5°F | Ht 67.0 in | Wt 306.9 lb

## 2024-10-01 DIAGNOSIS — Z8739 Personal history of other diseases of the musculoskeletal system and connective tissue: Secondary | ICD-10-CM

## 2024-10-01 DIAGNOSIS — Z0001 Encounter for general adult medical examination with abnormal findings: Secondary | ICD-10-CM

## 2024-10-01 DIAGNOSIS — E781 Pure hyperglyceridemia: Secondary | ICD-10-CM | POA: Diagnosis not present

## 2024-10-01 DIAGNOSIS — G4733 Obstructive sleep apnea (adult) (pediatric): Secondary | ICD-10-CM | POA: Diagnosis not present

## 2024-10-01 DIAGNOSIS — Z7985 Long-term (current) use of injectable non-insulin antidiabetic drugs: Secondary | ICD-10-CM

## 2024-10-01 DIAGNOSIS — E1140 Type 2 diabetes mellitus with diabetic neuropathy, unspecified: Secondary | ICD-10-CM | POA: Diagnosis not present

## 2024-10-01 DIAGNOSIS — K2100 Gastro-esophageal reflux disease with esophagitis, without bleeding: Secondary | ICD-10-CM

## 2024-10-01 DIAGNOSIS — I10 Essential (primary) hypertension: Secondary | ICD-10-CM

## 2024-10-01 DIAGNOSIS — Z0000 Encounter for general adult medical examination without abnormal findings: Secondary | ICD-10-CM

## 2024-10-01 DIAGNOSIS — E790 Hyperuricemia without signs of inflammatory arthritis and tophaceous disease: Secondary | ICD-10-CM

## 2024-10-01 DIAGNOSIS — Z125 Encounter for screening for malignant neoplasm of prostate: Secondary | ICD-10-CM

## 2024-10-01 NOTE — Progress Notes (Signed)
 "    Complete physical exam   Patient: Adam Wolfe   DOB: 10/04/1966   58 y.o. Male  MRN: [id number] Visit Date: 10/01/2024  Today's healthcare provider: Nancyann Perry, MD   Chief Complaint  Patient presents with   Annual Exam    Diet: normal  Exercise: walking at work  Sleep:7hrs  Overall feeling : feeling great    Subjective    Discussed the use of AI scribe software for clinical note transcription with the patient, who gave verbal consent to proceed.  History of Present Illness   Adam Wolfe is a 58 year old male who presents for a routine physical exam.  He experiences persistent neuropathy in his feet, described as a 'quick little burn,' which he feels is worsening. Gabapentin  300 mg at night helps him sleep. He compares the sensation to gout but has not had any flares since starting allopurinol .  He reports significant knee pain and difficulty walking, describing his knees as 'bone on bone.' He takes meloxicam for pain.  He manages type 2 diabetes with Ozempic  2 mg weekly, resulting in an improved A1c of 6.4. He attributes this improvement to better dietary habits, specifically avoiding overeating. He also takes vitamin B12 1000 mg daily and needs to pick up his allopurinol  prescription.  He uses a CPAP machine nightly for obstructive sleep apnea, which is effective, especially with a new machine. His wife ensures consistent use.  He takes valsartan  and rosuvastatin  but does not monitor his blood pressure at home. He notes occasional swelling in his feet after prolonged standing, which leaves a sock line. No chest pain, heart flutters, or breathing difficulties.     Lab Results  Component Value Date   HGBA1C 6.4 (A) 08/26/2024   HGBA1C 8.4 (A) 05/12/2024   HGBA1C 7.0 (A) 10/01/2023   Lab Results  Component Value Date   CHOL 107 10/01/2023   HDL 27 (L) 10/01/2023   LDLCALC 30 10/01/2023   TRIG 346 (H) 10/01/2023   CHOLHDL 4.0 10/01/2023   Lab  Results  Component Value Date   NA 140 10/01/2023   K 4.7 10/01/2023   CREATININE 1.13 10/01/2023   EGFR 77 10/01/2023   GLUCOSE 150 (H) 10/01/2023       Past Medical History:  Diagnosis Date   Diabetes mellitus without complication (HCC)    Gout    History of chicken pox    Sleep apnea    Past Surgical History:  Procedure Laterality Date   COLONOSCOPY WITH PROPOFOL  N/A 07/24/2018   Procedure: COLONOSCOPY WITH PROPOFOL ;  Surgeon: Therisa Bi, MD;  Location: [facility] ENDOSCOPY;  Service: Gastroenterology;  Laterality: N/A;   Overnight Oximetry  04/13/2004   showed sleep apnea- Sleep study was done 04/30/2004 showed severe Obstructive sleep apnea   sleep study  04/30/2004   Severe obstructive sleep apnea with nocturnal desaturations- started CPAP-13   Social History   Socioeconomic History   Marital status: Married    Spouse name: Not on file   Number of children: Not on file   Years of education: Not on file   Highest education level: Bachelor's degree (e.g., BA, AB, BS)  Occupational History   Occupation: Physiological Scientist: [employer]  Tobacco Use   Smoking status: Former    Current packs/day: 0.00    Types: Cigarettes    Quit date: 06/22/2024    Years since quitting: 0.2   Smokeless tobacco: Current    Types: Chew  Substance and  Sexual Activity   Alcohol use: Not Currently   Drug use: Never   Sexual activity: Yes    Birth control/protection: Surgical  Other Topics Concern   Not on file  Social History Narrative   CPAP- 13cm pressure   Social Drivers of Health   Tobacco Use: High Risk (10/01/2024)   Patient History    Smoking Tobacco Use: Former    Smokeless Tobacco Use: Current    Passive Exposure: Not on file  Financial Resource Strain: Low Risk (09/30/2024)   Overall Financial Resource Strain (CARDIA)    Difficulty of Paying Living Expenses: Not very hard  Food Insecurity: No Food Insecurity (09/30/2024)   Epic    Worried About  Radiation Protection Practitioner of Food in the Last Year: Never true    Ran Out of Food in the Last Year: Never true  Transportation Needs: No Transportation Needs (09/30/2024)   Epic    Lack of Transportation (Medical): No    Lack of Transportation (Non-Medical): No  Physical Activity: Insufficiently Active (09/30/2024)   Exercise Vital Sign    Days of Exercise per Week: 3 days    Minutes of Exercise per Session: 20 min  Stress: No Stress Concern Present (09/30/2024)   [employer] of Occupational Health - Occupational Stress Questionnaire    Feeling of Stress: Not at all  Social Connections: Socially Integrated (09/30/2024)   Social Connection and Isolation Panel    Frequency of Communication with Friends and Family: More than three times a week    Frequency of Social Gatherings with Friends and Family: Three times a week    Attends Religious Services: More than 4 times per year    Active Member of Clubs or Organizations: Yes    Attends Banker Meetings: More than 4 times per year    Marital Status: Married  Catering Manager Violence: Not on file  Depression (PHQ2-9): Low Risk (10/01/2024)   Depression (PHQ2-9)    PHQ-2 Score: 0  Alcohol Screen: Low Risk (03/03/2023)   Alcohol Screen    Last Alcohol Screening Score (AUDIT): 1  Housing: Unknown (09/30/2024)   Epic    Unable to Pay for Housing in the Last Year: No    Number of Times Moved in the Last Year: Not on file    Homeless in the Last Year: No  Utilities: Not on file  Health Literacy: Not on file   Family Status  Relation Name Status   Other gen. family hx Alive   Mother  Alive   Father  Deceased at age 94   Sister  Alive   Daughter  Alive   Daughter  Alive  No partnership data on file   Family History  Problem Relation Age of Onset   COPD Other        smoker   Hypertension Mother    Cancer Father    COPD Father    Allergies[1]  Patient Care Team: Gasper Nancyann BRAVO, MD as PCP - General (Family Medicine)    Medications: Show/hide medication list[2]  Review of Systems  Constitutional:  Negative for appetite change, chills and fever.  Respiratory:  Negative for chest tightness, shortness of breath and wheezing.   Cardiovascular:  Negative for chest pain and palpitations.  Gastrointestinal:  Negative for abdominal pain, nausea and vomiting.      Objective    BP 138/88 (BP Location: Left Arm, Patient Position: Sitting, Cuff Size: Normal)   Pulse 90   Temp (!) 97.5 F (36.4 C) (  Oral)   Ht 5' 7 (1.702 m)   Wt (!) 306 lb 14.4 oz (139.2 kg)   SpO2 98%   BMI 48.07 kg/m    Physical Exam  General Appearance:    Severely obese male. Alert, cooperative, in no acute distress, appears stated age  Head:    Normocephalic, without obvious abnormality, atraumatic  Eyes:    PERRL, conjunctiva/corneas clear, EOM's intact, fundi    benign, both eyes       Ears:    Normal TM's and external ear canals, both ears  Nose:   Nares normal, septum midline, mucosa normal, no drainage   or sinus tenderness  Throat:   Lips, mucosa, and tongue normal; teeth and gums normal  Neck:   Supple, symmetrical, trachea midline, no adenopathy;       thyroid :  No enlargement/tenderness/nodules; no carotid   bruit or JVD  Back:     Symmetric, no curvature, ROM normal, no CVA tenderness  Lungs:     Clear to auscultation bilaterally, respirations unlabored  Chest wall:    No tenderness or deformity  Heart:    Normal heart rate. Normal rhythm. No murmurs, rubs, or gallops.  S1 and S2 normal  Abdomen:     Soft, non-tender, bowel sounds active all four quadrants,    no masses, no organomegaly  Genitalia:    deferred  Rectal:    deferred  Extremities:   All extremities are intact. No cyanosis or edema  Pulses:   2+ and symmetric all extremities  Skin:   Skin color, texture, turgor normal, no rashes or lesions  Lymph nodes:   Cervical, supraclavicular, and axillary nodes normal  Neurologic:   CNII-XII intact. Normal  strength, sensation and reflexes      throughout       Last depression screening scores    10/01/2024    8:51 AM 05/12/2024    8:42 AM 05/23/2023    8:26 AM  PHQ 2/9 Scores  PHQ - 2 Score 0 0 0  PHQ- 9 Score 0 1       Data saved with a previous flowsheet row definition   Last fall risk screening    10/01/2024    8:50 AM  Fall Risk   Falls in the past year? 1  Number falls in past yr: 1  Injury with Fall? 0  Risk for fall due to : History of fall(s)  Follow up Falls evaluation completed   Last Audit-C alcohol use screening    09/30/2024    7:40 PM  Alcohol Use Disorder Test (AUDIT)  1. How often do you have a drink containing alcohol? 1   3. How often do you have six or more drinks on one occasion? 0      Manually entered by patient   A score of 3 or more in women, and 4 or more in men indicates increased risk for alcohol abuse, EXCEPT if all of the points are from question 1     Assessment & Plan    Routine Health Maintenance and Physical Exam  Exercise Activities and Dietary recommendations  Goals   None     Immunization History  Administered Date(s) Administered   Influenza,inj,Quad PF,6+ Mos 07/03/2017, 08/26/2018   Influenza-Unspecified 08/26/2018   Tdap 08/26/2018    Health Maintenance  Topic Date Due   Colonoscopy  07/25/2019   OPHTHALMOLOGY EXAM  10/05/2022   Diabetic kidney evaluation - eGFR measurement  09/30/2024   COVID-19 Vaccine (1 -  2025-26 season) 10/17/2024 (Originally 06/14/2024)   Zoster Vaccines- Shingrix (1 of 2) 12/30/2024 (Originally 02/23/2018)   Influenza Vaccine  01/11/2025 (Originally 05/14/2024)   Pneumococcal Vaccine: 50+ Years (1 of 2 - PCV) 10/01/2025 (Originally 02/24/1987)   Hepatitis B Vaccines 19-59 Average Risk (1 of 3 - 19+ 3-dose series) 10/01/2025 (Originally 02/24/1987)   HIV Screening  10/01/2025 (Originally 02/24/1983)   HEMOGLOBIN A1C  02/23/2025   Diabetic kidney evaluation - Urine ACR  08/26/2025   DTaP/Tdap/Td (2  - Td or Tdap) 08/26/2028   Hepatitis C Screening  Completed   HPV VACCINES  Aged Out   Meningococcal B Vaccine  Aged Out    Discussed health benefits of physical activity, and encouraged him to engage in regular exercise appropriate for his age and condition.     Type 2 diabetes mellitus with diabetic neuropathy Diabetic neuropathy slightly worsening but controlled with gabapentin . A1c improved to 6.4. Ozempic  well-tolerated. Mounjaro discussed for potential weight loss. - Continue gabapentin  300 mg at night. - Continue Ozempic  2 mg. - Consider Mounjaro for weight loss if needed. - Order lab work to monitor A1c.  Essential hypertension Blood pressure slightly elevated. On valsartan  and rosuvastatin . - Continue valsartan .  Morbid obesity Weight maintained with Ozempic . Discussed Mounjaro for enhanced weight loss. Orthopedist recommends weight loss for knee surgery. - Continue Ozempic . - Consider Mounjaro for weight loss if needed.  Obstructive sleep apnea Well-managed with CPAP. Good compliance and effectiveness reported. - Continue CPAP therapy.  Knee osteoarthritis Bone-on-bone changes. Meloxicam refill requires orthopedist approval. Considering knee surgery. Discussed handicap placard. - Follow up with orthopedist for meloxicam refill and management.  Personal history of gout and hyperuricemia No recent gout flares. Allopurinol  well-tolerated. - Continue allopurinol .  General Health Maintenance Due for colonoscopy and eye exam. Insurance covers colonoscopy. - Schedule colonoscopy and eye exam.     Hypertriglyceridemia He is tolerating rosuvastatin  well with no adverse effects.   - Comprehensive metabolic panel with eGFR (no eGFR if sent to [facility]) [LAB17] - Lipid panel - CBC  OSA on CPAP Using CPAP consistently and medically benefiting from its use.   Gastroesophageal reflux disease with esophagitis, unspecified whether hemorrhage Currently asymotatic.  - VITAMIN  D 25 Hydroxy (Vit-D Deficiency, Fractures)  Personal history of gout No recent flares, on allopurinol   Hyperuricemia - Uric acid     Return in about 3 months (around 12/30/2024) for Hypertension, Diabetes.       Nancyann Perry, MD  [facility] [phone] (phone) [phone] (fax)  [facility]     [1]  Allergies Allergen Reactions   Lasix  [Furosemide ] Hives  [2]  Outpatient Medications Prior to Visit  Medication Sig   allopurinol  (ZYLOPRIM ) 300 MG tablet TAKE 1 TABLET BY MOUTH EVERY DAY   ascorbic acid (VITAMIN C) 500 MG tablet Take 500 mg by mouth daily.   gabapentin  (NEURONTIN ) 300 MG capsule TAKE 1 CAPSULE BY MOUTH EVERYDAY AT BEDTIME   glucose blood test strip Use to check sugar daily for type 2 diabetes E11.9   meloxicam (MOBIC) 7.5 MG tablet Take 7.5 mg by mouth 2 (two) times daily with a meal.   metFORMIN  (GLUCOPHAGE -XR) 500 MG 24 hr tablet TAKE 2 TABLETS (1,000 MG TOTAL) BY MOUTH 2 (TWO) TIMES DAILY BEFORE A MEAL.   ONETOUCH DELICA LANCETS FINE MISC Use to check blood sugar daily   rosuvastatin  (CRESTOR ) 10 MG tablet TAKE 1 TABLET BY MOUTH EVERY DAY   Semaglutide , 2 MG/DOSE, (OZEMPIC , 2 MG/DOSE,)  8 MG/3ML SOPN INJECT 2 MG INTO THE SKIN ONCE A WEEK.   valsartan  (DIOVAN ) 80 MG tablet TAKE 1 TABLET BY MOUTH EVERY DAY   vitamin B-12 (CYANOCOBALAMIN) 1000 MCG tablet Take 1,000 mcg by mouth daily.   No facility-administered medications prior to visit.   "

## 2024-10-01 NOTE — Patient Instructions (Signed)
 Please review the attached list of medications and notify my office if there are any errors.   Please contact your eyecare professional to schedule a routine eye exam   Call [facility] at [phone] to schedule your screening colonoscopy.

## 2024-10-02 LAB — COMPREHENSIVE METABOLIC PANEL WITH GFR
ALT: 36 IU/L (ref 0–44)
AST: 27 IU/L (ref 0–40)
Albumin: 4.4 g/dL (ref 3.8–4.9)
Alkaline Phosphatase: 78 IU/L (ref 47–123)
BUN/Creatinine Ratio: 17 (ref 9–20)
BUN: 18 mg/dL (ref 6–24)
Bilirubin Total: 0.4 mg/dL (ref 0.0–1.2)
CO2: 20 mmol/L (ref 20–29)
Calcium: 9.6 mg/dL (ref 8.7–10.2)
Chloride: 101 mmol/L (ref 96–106)
Creatinine, Ser: 1.05 mg/dL (ref 0.76–1.27)
Globulin, Total: 2.6 g/dL (ref 1.5–4.5)
Glucose: 163 mg/dL — ABNORMAL HIGH (ref 70–99)
Potassium: 4.4 mmol/L (ref 3.5–5.2)
Sodium: 138 mmol/L (ref 134–144)
Total Protein: 7 g/dL (ref 6.0–8.5)
eGFR: 83 mL/min/1.73

## 2024-10-02 LAB — LIPID PANEL
Chol/HDL Ratio: 4 ratio (ref 0.0–5.0)
Cholesterol, Total: 115 mg/dL (ref 100–199)
HDL: 29 mg/dL — ABNORMAL LOW
LDL Chol Calc (NIH): 41 mg/dL (ref 0–99)
Triglycerides: 298 mg/dL — ABNORMAL HIGH (ref 0–149)
VLDL Cholesterol Cal: 45 mg/dL — ABNORMAL HIGH (ref 5–40)

## 2024-10-02 LAB — VITAMIN B12: Vitamin B-12: 1308 pg/mL — ABNORMAL HIGH (ref 232–1245)

## 2024-10-02 LAB — CBC
Hematocrit: 46.7 % (ref 37.5–51.0)
Hemoglobin: 15.5 g/dL (ref 13.0–17.7)
MCH: 30.7 pg (ref 26.6–33.0)
MCHC: 33.2 g/dL (ref 31.5–35.7)
MCV: 93 fL (ref 79–97)
Platelets: 209 x10E3/uL (ref 150–450)
RBC: 5.05 x10E6/uL (ref 4.14–5.80)
RDW: 14.2 % (ref 11.6–15.4)
WBC: 7.2 x10E3/uL (ref 3.4–10.8)

## 2024-10-02 LAB — URIC ACID: Uric Acid: 5 mg/dL (ref 3.8–8.4)

## 2024-10-02 LAB — VITAMIN D 25 HYDROXY (VIT D DEFICIENCY, FRACTURES): Vit D, 25-Hydroxy: 13.2 ng/mL — ABNORMAL LOW (ref 30.0–100.0)

## 2024-10-02 LAB — PSA TOTAL (REFLEX TO FREE): Prostate Specific Ag, Serum: 1.2 ng/mL (ref 0.0–4.0)

## 2024-10-03 ENCOUNTER — Ambulatory Visit: Payer: Self-pay | Admitting: Family Medicine

## 2024-10-03 DIAGNOSIS — E559 Vitamin D deficiency, unspecified: Secondary | ICD-10-CM | POA: Insufficient documentation

## 2024-10-03 DIAGNOSIS — E1140 Type 2 diabetes mellitus with diabetic neuropathy, unspecified: Secondary | ICD-10-CM

## 2024-11-10 ENCOUNTER — Other Ambulatory Visit: Payer: Self-pay | Admitting: Family Medicine

## 2024-11-10 DIAGNOSIS — I10 Essential (primary) hypertension: Secondary | ICD-10-CM

## 2025-01-05 ENCOUNTER — Ambulatory Visit: Admitting: Family Medicine
